# Patient Record
Sex: Female | Born: 1935 | Race: Black or African American | Hispanic: No | Smoking: Former smoker
Health system: Southern US, Community
[De-identification: ages and names within clinical notes are randomized; demographics above are authoritative.]

## PROBLEM LIST (undated history)

## (undated) DIAGNOSIS — E119 Type 2 diabetes mellitus without complications: Secondary | ICD-10-CM

## (undated) DIAGNOSIS — R42 Dizziness and giddiness: Secondary | ICD-10-CM

## (undated) DIAGNOSIS — J449 Chronic obstructive pulmonary disease, unspecified: Secondary | ICD-10-CM

## (undated) DIAGNOSIS — M1990 Unspecified osteoarthritis, unspecified site: Secondary | ICD-10-CM

## (undated) DIAGNOSIS — K219 Gastro-esophageal reflux disease without esophagitis: Secondary | ICD-10-CM

## (undated) DIAGNOSIS — I10 Essential (primary) hypertension: Secondary | ICD-10-CM

## (undated) DIAGNOSIS — F068 Other specified mental disorders due to known physiological condition: Secondary | ICD-10-CM

## (undated) DIAGNOSIS — J45909 Unspecified asthma, uncomplicated: Secondary | ICD-10-CM

## (undated) HISTORY — DX: Unspecified osteoarthritis, unspecified site: M19.90

## (undated) HISTORY — DX: Unspecified asthma, uncomplicated: J45.909

## (undated) HISTORY — DX: Essential (primary) hypertension: I10

## (undated) HISTORY — PX: CHOLECYSTECTOMY: SHX55

## (undated) HISTORY — DX: Other specified mental disorders due to known physiological condition: F06.8

## (undated) HISTORY — DX: Chronic obstructive pulmonary disease, unspecified: J44.9

## (undated) HISTORY — PX: FOOT SURGERY: SHX648

## (undated) HISTORY — DX: Gastro-esophageal reflux disease without esophagitis: K21.9

## (undated) HISTORY — DX: Type 2 diabetes mellitus without complications: E11.9

## (undated) HISTORY — PX: ABDOMINAL HYSTERECTOMY: SHX81

## (undated) HISTORY — DX: Dizziness and giddiness: R42

---

## 1997-11-24 ENCOUNTER — Other Ambulatory Visit: Admission: RE | Admit: 1997-11-24 | Discharge: 1997-11-24 | Payer: Self-pay | Admitting: Obstetrics and Gynecology

## 1997-12-08 ENCOUNTER — Ambulatory Visit (HOSPITAL_COMMUNITY): Admission: RE | Admit: 1997-12-08 | Discharge: 1997-12-08 | Payer: Self-pay | Admitting: Obstetrics and Gynecology

## 1998-05-25 ENCOUNTER — Other Ambulatory Visit: Admission: RE | Admit: 1998-05-25 | Discharge: 1998-05-25 | Payer: Self-pay | Admitting: Nephrology

## 1999-01-04 ENCOUNTER — Ambulatory Visit (HOSPITAL_COMMUNITY): Admission: RE | Admit: 1999-01-04 | Discharge: 1999-01-04 | Payer: Self-pay | Admitting: Nephrology

## 2000-01-10 ENCOUNTER — Ambulatory Visit (HOSPITAL_COMMUNITY): Admission: RE | Admit: 2000-01-10 | Discharge: 2000-01-10 | Payer: Self-pay | Admitting: Nephrology

## 2000-01-10 ENCOUNTER — Encounter: Payer: Self-pay | Admitting: Nephrology

## 2001-01-11 ENCOUNTER — Encounter: Payer: Self-pay | Admitting: Nephrology

## 2001-01-11 ENCOUNTER — Ambulatory Visit (HOSPITAL_COMMUNITY): Admission: RE | Admit: 2001-01-11 | Discharge: 2001-01-11 | Payer: Self-pay | Admitting: Nephrology

## 2001-05-10 ENCOUNTER — Encounter: Payer: Self-pay | Admitting: Nephrology

## 2001-05-10 ENCOUNTER — Encounter: Admission: RE | Admit: 2001-05-10 | Discharge: 2001-05-10 | Payer: Self-pay | Admitting: Nephrology

## 2001-10-12 ENCOUNTER — Other Ambulatory Visit: Admission: RE | Admit: 2001-10-12 | Discharge: 2001-10-12 | Payer: Self-pay | Admitting: Nephrology

## 2002-01-14 ENCOUNTER — Ambulatory Visit (HOSPITAL_COMMUNITY): Admission: RE | Admit: 2002-01-14 | Discharge: 2002-01-14 | Payer: Self-pay | Admitting: Nephrology

## 2002-01-14 ENCOUNTER — Encounter: Payer: Self-pay | Admitting: Nephrology

## 2002-12-18 ENCOUNTER — Emergency Department (HOSPITAL_COMMUNITY): Admission: EM | Admit: 2002-12-18 | Discharge: 2002-12-18 | Payer: Self-pay | Admitting: Emergency Medicine

## 2003-01-16 ENCOUNTER — Ambulatory Visit (HOSPITAL_COMMUNITY): Admission: RE | Admit: 2003-01-16 | Discharge: 2003-01-16 | Payer: Self-pay | Admitting: Nephrology

## 2003-01-16 ENCOUNTER — Encounter: Payer: Self-pay | Admitting: Nephrology

## 2003-07-12 ENCOUNTER — Emergency Department (HOSPITAL_COMMUNITY): Admission: AD | Admit: 2003-07-12 | Discharge: 2003-07-12 | Payer: Self-pay | Admitting: Internal Medicine

## 2003-07-14 ENCOUNTER — Inpatient Hospital Stay (HOSPITAL_COMMUNITY): Admission: EM | Admit: 2003-07-14 | Discharge: 2003-07-16 | Payer: Self-pay | Admitting: *Deleted

## 2003-09-23 ENCOUNTER — Encounter: Admission: RE | Admit: 2003-09-23 | Discharge: 2003-09-23 | Payer: Self-pay | Admitting: Nephrology

## 2003-10-11 ENCOUNTER — Inpatient Hospital Stay (HOSPITAL_COMMUNITY): Admission: EM | Admit: 2003-10-11 | Discharge: 2003-10-16 | Payer: Self-pay | Admitting: Emergency Medicine

## 2003-10-31 ENCOUNTER — Encounter (INDEPENDENT_AMBULATORY_CARE_PROVIDER_SITE_OTHER): Payer: Self-pay | Admitting: Specialist

## 2003-11-01 ENCOUNTER — Inpatient Hospital Stay (HOSPITAL_COMMUNITY): Admission: RE | Admit: 2003-11-01 | Discharge: 2003-11-04 | Payer: Self-pay | Admitting: General Surgery

## 2003-12-05 ENCOUNTER — Observation Stay (HOSPITAL_COMMUNITY): Admission: EM | Admit: 2003-12-05 | Discharge: 2003-12-06 | Payer: Self-pay | Admitting: Emergency Medicine

## 2004-02-01 ENCOUNTER — Emergency Department (HOSPITAL_COMMUNITY): Admission: EM | Admit: 2004-02-01 | Discharge: 2004-02-01 | Payer: Self-pay | Admitting: Emergency Medicine

## 2004-02-04 ENCOUNTER — Ambulatory Visit (HOSPITAL_COMMUNITY): Admission: RE | Admit: 2004-02-04 | Discharge: 2004-02-04 | Payer: Self-pay | Admitting: Nephrology

## 2004-06-20 ENCOUNTER — Emergency Department (HOSPITAL_COMMUNITY): Admission: EM | Admit: 2004-06-20 | Discharge: 2004-06-20 | Payer: Self-pay | Admitting: Emergency Medicine

## 2004-08-03 ENCOUNTER — Inpatient Hospital Stay (HOSPITAL_COMMUNITY): Admission: EM | Admit: 2004-08-03 | Discharge: 2004-08-09 | Payer: Self-pay | Admitting: Emergency Medicine

## 2004-08-03 ENCOUNTER — Ambulatory Visit: Payer: Self-pay | Admitting: Hematology and Oncology

## 2004-08-05 ENCOUNTER — Encounter (INDEPENDENT_AMBULATORY_CARE_PROVIDER_SITE_OTHER): Payer: Self-pay | Admitting: *Deleted

## 2004-08-08 ENCOUNTER — Ambulatory Visit: Payer: Self-pay | Admitting: Oncology

## 2004-08-12 ENCOUNTER — Encounter: Admission: RE | Admit: 2004-08-12 | Discharge: 2004-08-12 | Payer: Self-pay | Admitting: Nephrology

## 2004-08-18 ENCOUNTER — Ambulatory Visit: Payer: Self-pay | Admitting: Hematology and Oncology

## 2004-12-02 ENCOUNTER — Encounter: Admission: RE | Admit: 2004-12-02 | Discharge: 2004-12-02 | Payer: Self-pay | Admitting: Nephrology

## 2004-12-08 ENCOUNTER — Ambulatory Visit: Payer: Self-pay | Admitting: Hematology and Oncology

## 2005-01-27 ENCOUNTER — Ambulatory Visit (HOSPITAL_COMMUNITY): Admission: RE | Admit: 2005-01-27 | Discharge: 2005-01-27 | Payer: Self-pay | Admitting: Nephrology

## 2005-02-12 ENCOUNTER — Emergency Department (HOSPITAL_COMMUNITY): Admission: EM | Admit: 2005-02-12 | Discharge: 2005-02-12 | Payer: Self-pay | Admitting: Emergency Medicine

## 2005-02-18 ENCOUNTER — Encounter: Admission: RE | Admit: 2005-02-18 | Discharge: 2005-02-18 | Payer: Self-pay | Admitting: Nephrology

## 2005-02-23 ENCOUNTER — Encounter: Admission: RE | Admit: 2005-02-23 | Discharge: 2005-02-23 | Payer: Self-pay | Admitting: Nephrology

## 2005-05-26 ENCOUNTER — Ambulatory Visit (HOSPITAL_COMMUNITY): Admission: RE | Admit: 2005-05-26 | Discharge: 2005-05-26 | Payer: Self-pay | Admitting: Ophthalmology

## 2005-11-19 ENCOUNTER — Emergency Department (HOSPITAL_COMMUNITY): Admission: EM | Admit: 2005-11-19 | Discharge: 2005-11-19 | Payer: Self-pay | Admitting: Emergency Medicine

## 2006-01-24 ENCOUNTER — Ambulatory Visit (HOSPITAL_COMMUNITY): Admission: RE | Admit: 2006-01-24 | Discharge: 2006-01-24 | Payer: Self-pay | Admitting: Nephrology

## 2006-02-23 ENCOUNTER — Emergency Department (HOSPITAL_COMMUNITY): Admission: EM | Admit: 2006-02-23 | Discharge: 2006-02-23 | Payer: Self-pay | Admitting: Emergency Medicine

## 2006-04-25 ENCOUNTER — Encounter: Admission: RE | Admit: 2006-04-25 | Discharge: 2006-04-25 | Payer: Self-pay | Admitting: Nephrology

## 2006-05-15 ENCOUNTER — Emergency Department (HOSPITAL_COMMUNITY): Admission: EM | Admit: 2006-05-15 | Discharge: 2006-05-15 | Payer: Self-pay | Admitting: Emergency Medicine

## 2006-10-19 ENCOUNTER — Encounter: Admission: RE | Admit: 2006-10-19 | Discharge: 2006-10-19 | Payer: Self-pay | Admitting: Nephrology

## 2006-11-05 ENCOUNTER — Emergency Department (HOSPITAL_COMMUNITY): Admission: EM | Admit: 2006-11-05 | Discharge: 2006-11-05 | Payer: Self-pay | Admitting: Emergency Medicine

## 2007-01-27 ENCOUNTER — Emergency Department (HOSPITAL_COMMUNITY): Admission: EM | Admit: 2007-01-27 | Discharge: 2007-01-28 | Payer: Self-pay | Admitting: Emergency Medicine

## 2007-01-29 ENCOUNTER — Ambulatory Visit (HOSPITAL_COMMUNITY): Admission: RE | Admit: 2007-01-29 | Discharge: 2007-01-29 | Payer: Self-pay | Admitting: Nephrology

## 2007-01-30 ENCOUNTER — Ambulatory Visit (HOSPITAL_COMMUNITY): Admission: RE | Admit: 2007-01-30 | Discharge: 2007-01-30 | Payer: Self-pay | Admitting: Nephrology

## 2007-04-06 ENCOUNTER — Ambulatory Visit (HOSPITAL_COMMUNITY): Admission: RE | Admit: 2007-04-06 | Discharge: 2007-04-06 | Payer: Self-pay | Admitting: Ophthalmology

## 2007-07-05 ENCOUNTER — Emergency Department (HOSPITAL_COMMUNITY): Admission: EM | Admit: 2007-07-05 | Discharge: 2007-07-05 | Payer: Self-pay | Admitting: Emergency Medicine

## 2007-11-03 ENCOUNTER — Emergency Department (HOSPITAL_COMMUNITY): Admission: EM | Admit: 2007-11-03 | Discharge: 2007-11-03 | Payer: Self-pay | Admitting: Emergency Medicine

## 2008-01-19 ENCOUNTER — Emergency Department (HOSPITAL_COMMUNITY): Admission: EM | Admit: 2008-01-19 | Discharge: 2008-01-19 | Payer: Self-pay | Admitting: Emergency Medicine

## 2008-01-31 ENCOUNTER — Ambulatory Visit (HOSPITAL_COMMUNITY): Admission: RE | Admit: 2008-01-31 | Discharge: 2008-01-31 | Payer: Self-pay | Admitting: Otolaryngology

## 2008-02-24 ENCOUNTER — Emergency Department (HOSPITAL_COMMUNITY): Admission: EM | Admit: 2008-02-24 | Discharge: 2008-02-24 | Payer: Self-pay | Admitting: Emergency Medicine

## 2008-06-11 ENCOUNTER — Encounter: Admission: RE | Admit: 2008-06-11 | Discharge: 2008-06-11 | Payer: Self-pay | Admitting: Nephrology

## 2008-08-20 ENCOUNTER — Emergency Department (HOSPITAL_COMMUNITY): Admission: EM | Admit: 2008-08-20 | Discharge: 2008-08-20 | Payer: Self-pay | Admitting: Internal Medicine

## 2008-08-26 ENCOUNTER — Encounter: Admission: RE | Admit: 2008-08-26 | Discharge: 2008-08-26 | Payer: Self-pay | Admitting: Nephrology

## 2009-02-02 ENCOUNTER — Emergency Department (HOSPITAL_COMMUNITY): Admission: EM | Admit: 2009-02-02 | Discharge: 2009-02-02 | Payer: Self-pay | Admitting: Emergency Medicine

## 2009-02-09 ENCOUNTER — Ambulatory Visit (HOSPITAL_COMMUNITY): Admission: RE | Admit: 2009-02-09 | Discharge: 2009-02-09 | Payer: Self-pay | Admitting: Nephrology

## 2009-03-25 ENCOUNTER — Emergency Department (HOSPITAL_COMMUNITY): Admission: EM | Admit: 2009-03-25 | Discharge: 2009-03-25 | Payer: Self-pay | Admitting: Emergency Medicine

## 2009-03-26 ENCOUNTER — Inpatient Hospital Stay (HOSPITAL_COMMUNITY): Admission: EM | Admit: 2009-03-26 | Discharge: 2009-03-29 | Payer: Self-pay | Admitting: Emergency Medicine

## 2009-03-27 ENCOUNTER — Encounter (INDEPENDENT_AMBULATORY_CARE_PROVIDER_SITE_OTHER): Payer: Self-pay | Admitting: Internal Medicine

## 2009-06-13 ENCOUNTER — Inpatient Hospital Stay (HOSPITAL_COMMUNITY): Admission: EM | Admit: 2009-06-13 | Discharge: 2009-06-23 | Payer: Self-pay | Admitting: Emergency Medicine

## 2009-06-16 ENCOUNTER — Ambulatory Visit: Payer: Self-pay | Admitting: Vascular Surgery

## 2009-06-16 ENCOUNTER — Encounter (INDEPENDENT_AMBULATORY_CARE_PROVIDER_SITE_OTHER): Payer: Self-pay | Admitting: Internal Medicine

## 2009-06-18 LAB — CONVERTED CEMR LAB: Cholesterol: 134 mg/dL

## 2009-07-01 ENCOUNTER — Emergency Department (HOSPITAL_COMMUNITY): Admission: EM | Admit: 2009-07-01 | Discharge: 2009-07-02 | Payer: Self-pay | Admitting: Emergency Medicine

## 2009-07-15 ENCOUNTER — Encounter: Admission: RE | Admit: 2009-07-15 | Discharge: 2009-07-15 | Payer: Self-pay | Admitting: Nephrology

## 2009-07-26 ENCOUNTER — Emergency Department (HOSPITAL_COMMUNITY): Admission: EM | Admit: 2009-07-26 | Discharge: 2009-07-26 | Payer: Self-pay | Admitting: Emergency Medicine

## 2009-08-05 ENCOUNTER — Emergency Department (HOSPITAL_COMMUNITY): Admission: EM | Admit: 2009-08-05 | Discharge: 2009-08-05 | Payer: Self-pay | Admitting: Emergency Medicine

## 2009-09-28 ENCOUNTER — Emergency Department (HOSPITAL_COMMUNITY): Admission: EM | Admit: 2009-09-28 | Discharge: 2009-09-28 | Payer: Self-pay | Admitting: Emergency Medicine

## 2009-10-07 ENCOUNTER — Emergency Department (HOSPITAL_COMMUNITY): Admission: EM | Admit: 2009-10-07 | Discharge: 2009-10-08 | Payer: Self-pay | Admitting: Emergency Medicine

## 2009-10-13 ENCOUNTER — Encounter: Admission: RE | Admit: 2009-10-13 | Discharge: 2009-10-13 | Payer: Self-pay | Admitting: Nephrology

## 2009-10-14 ENCOUNTER — Emergency Department (HOSPITAL_COMMUNITY): Admission: EM | Admit: 2009-10-14 | Discharge: 2009-10-14 | Payer: Self-pay | Admitting: Emergency Medicine

## 2009-12-15 ENCOUNTER — Ambulatory Visit (HOSPITAL_BASED_OUTPATIENT_CLINIC_OR_DEPARTMENT_OTHER): Admission: RE | Admit: 2009-12-15 | Discharge: 2009-12-15 | Payer: Self-pay | Admitting: Obstetrics & Gynecology

## 2009-12-20 ENCOUNTER — Ambulatory Visit: Payer: Self-pay | Admitting: Internal Medicine

## 2010-01-01 ENCOUNTER — Emergency Department (HOSPITAL_COMMUNITY): Admission: EM | Admit: 2010-01-01 | Discharge: 2010-01-01 | Payer: Self-pay | Admitting: Emergency Medicine

## 2010-01-07 ENCOUNTER — Ambulatory Visit: Payer: Self-pay | Admitting: Vascular Surgery

## 2010-01-08 ENCOUNTER — Emergency Department (HOSPITAL_COMMUNITY): Admission: EM | Admit: 2010-01-08 | Discharge: 2010-01-08 | Payer: Self-pay | Admitting: Emergency Medicine

## 2010-01-27 ENCOUNTER — Emergency Department (HOSPITAL_COMMUNITY): Admission: EM | Admit: 2010-01-27 | Discharge: 2010-01-27 | Payer: Self-pay | Admitting: Emergency Medicine

## 2010-03-18 ENCOUNTER — Emergency Department (HOSPITAL_COMMUNITY): Admission: EM | Admit: 2010-03-18 | Discharge: 2010-03-18 | Payer: Self-pay | Admitting: Emergency Medicine

## 2010-03-21 ENCOUNTER — Emergency Department (HOSPITAL_COMMUNITY): Admission: EM | Admit: 2010-03-21 | Discharge: 2010-03-21 | Payer: Self-pay | Admitting: Emergency Medicine

## 2010-03-26 ENCOUNTER — Ambulatory Visit (HOSPITAL_COMMUNITY): Admission: RE | Admit: 2010-03-26 | Discharge: 2010-03-26 | Payer: Self-pay | Admitting: Internal Medicine

## 2010-03-26 LAB — HM MAMMOGRAPHY: HM Mammogram: NEGATIVE

## 2010-03-28 ENCOUNTER — Emergency Department (HOSPITAL_COMMUNITY): Admission: EM | Admit: 2010-03-28 | Discharge: 2010-03-28 | Payer: Self-pay | Admitting: Emergency Medicine

## 2010-04-14 ENCOUNTER — Emergency Department (HOSPITAL_COMMUNITY): Admission: EM | Admit: 2010-04-14 | Discharge: 2010-04-14 | Payer: Self-pay | Admitting: Emergency Medicine

## 2010-05-10 ENCOUNTER — Emergency Department (HOSPITAL_COMMUNITY): Admission: EM | Admit: 2010-05-10 | Discharge: 2010-05-11 | Payer: Self-pay | Admitting: Emergency Medicine

## 2010-05-10 LAB — CONVERTED CEMR LAB
ALT: 34 units/L
AST: 21 units/L
Alkaline Phosphatase: 123 units/L
Calcium: 9.6 mg/dL
Chloride: 100 meq/L
Eosinophils Relative: 8 %
Glucose, Bld: 134 mg/dL
Hemoglobin: 13.1 g/dL
Neutrophils Relative %: 68 %
RBC: 4.11 M/uL
WBC: 8.5 10*3/uL

## 2010-05-18 ENCOUNTER — Ambulatory Visit: Payer: Self-pay | Admitting: Internal Medicine

## 2010-05-18 DIAGNOSIS — E119 Type 2 diabetes mellitus without complications: Secondary | ICD-10-CM

## 2010-05-18 DIAGNOSIS — J45909 Unspecified asthma, uncomplicated: Secondary | ICD-10-CM

## 2010-05-18 DIAGNOSIS — M1990 Unspecified osteoarthritis, unspecified site: Secondary | ICD-10-CM

## 2010-05-18 DIAGNOSIS — I10 Essential (primary) hypertension: Secondary | ICD-10-CM | POA: Insufficient documentation

## 2010-05-18 DIAGNOSIS — F068 Other specified mental disorders due to known physiological condition: Secondary | ICD-10-CM

## 2010-05-18 DIAGNOSIS — J449 Chronic obstructive pulmonary disease, unspecified: Secondary | ICD-10-CM

## 2010-05-18 DIAGNOSIS — K219 Gastro-esophageal reflux disease without esophagitis: Secondary | ICD-10-CM

## 2010-05-18 DIAGNOSIS — J4489 Other specified chronic obstructive pulmonary disease: Secondary | ICD-10-CM | POA: Insufficient documentation

## 2010-05-18 DIAGNOSIS — R634 Abnormal weight loss: Secondary | ICD-10-CM

## 2010-05-18 HISTORY — DX: Chronic obstructive pulmonary disease, unspecified: J44.9

## 2010-05-18 HISTORY — DX: Unspecified osteoarthritis, unspecified site: M19.90

## 2010-05-18 HISTORY — DX: Gastro-esophageal reflux disease without esophagitis: K21.9

## 2010-05-18 HISTORY — DX: Other specified chronic obstructive pulmonary disease: J44.89

## 2010-05-18 HISTORY — DX: Other specified mental disorders due to known physiological condition: F06.8

## 2010-05-18 HISTORY — DX: Unspecified asthma, uncomplicated: J45.909

## 2010-05-18 HISTORY — DX: Type 2 diabetes mellitus without complications: E11.9

## 2010-05-18 HISTORY — DX: Essential (primary) hypertension: I10

## 2010-05-19 LAB — CONVERTED CEMR LAB
Hgb A1c MFr Bld: 6.5 % (ref 4.6–6.5)
TSH: 0.97 microintl units/mL (ref 0.35–5.50)

## 2010-05-27 ENCOUNTER — Emergency Department (HOSPITAL_COMMUNITY): Admission: EM | Admit: 2010-05-27 | Discharge: 2009-10-16 | Payer: Self-pay | Admitting: Emergency Medicine

## 2010-06-09 ENCOUNTER — Observation Stay (HOSPITAL_COMMUNITY)
Admission: EM | Admit: 2010-06-09 | Discharge: 2010-06-10 | Payer: Self-pay | Source: Home / Self Care | Attending: Internal Medicine | Admitting: Internal Medicine

## 2010-06-29 ENCOUNTER — Encounter: Payer: Self-pay | Admitting: Internal Medicine

## 2010-06-29 ENCOUNTER — Ambulatory Visit
Admission: RE | Admit: 2010-06-29 | Discharge: 2010-06-29 | Payer: Self-pay | Source: Home / Self Care | Attending: Internal Medicine | Admitting: Internal Medicine

## 2010-06-29 DIAGNOSIS — R42 Dizziness and giddiness: Secondary | ICD-10-CM

## 2010-06-29 HISTORY — DX: Dizziness and giddiness: R42

## 2010-07-11 ENCOUNTER — Encounter: Payer: Self-pay | Admitting: Nephrology

## 2010-07-12 ENCOUNTER — Encounter: Payer: Self-pay | Admitting: Nephrology

## 2010-07-14 ENCOUNTER — Inpatient Hospital Stay (HOSPITAL_COMMUNITY)
Admission: EM | Admit: 2010-07-14 | Discharge: 2010-07-19 | Payer: Self-pay | Source: Home / Self Care | Attending: Internal Medicine | Admitting: Internal Medicine

## 2010-07-14 LAB — COMPREHENSIVE METABOLIC PANEL
BUN: 19 mg/dL (ref 6–23)
CO2: 23 mEq/L (ref 19–32)
Calcium: 9.8 mg/dL (ref 8.4–10.5)
Creatinine, Ser: 0.69 mg/dL (ref 0.4–1.2)
GFR calc non Af Amer: >60 mL/min (ref >60)
Glucose, Bld: 173 mg/dL — ABNORMAL HIGH (ref 70–99)

## 2010-07-14 LAB — URINALYSIS, ROUTINE W REFLEX MICROSCOPIC
Hgb urine dipstick: NEGATIVE
Ketones, ur: NEGATIVE mg/dL
Protein, ur: NEGATIVE mg/dL
Urine Glucose, Fasting: NEGATIVE mg/dL
pH: 6 (ref 5.0–8.0)

## 2010-07-14 LAB — LIPASE, BLOOD: Lipase: 215 U/L — ABNORMAL HIGH (ref 11–59)

## 2010-07-14 LAB — DIFFERENTIAL
Eosinophils Absolute: 0.2 10*3/uL (ref 0.0–0.7)
Lymphs Abs: 2.3 10*3/uL (ref 0.7–4.0)
Monocytes Relative: 6 % (ref 3–12)
Neutrophils Relative %: 67 % (ref 43–77)

## 2010-07-14 LAB — POCT CARDIAC MARKERS
CKMB, poc: <1 ng/mL — ABNORMAL LOW (ref 1.0–8.0)
CKMB, poc: <1 ng/mL — ABNORMAL LOW (ref 1.0–8.0)
Myoglobin, poc: 53.8 ng/mL (ref 12–200)
Myoglobin, poc: 55.3 ng/mL (ref 12–200)

## 2010-07-14 LAB — URINE MICROSCOPIC-ADD ON

## 2010-07-14 LAB — CBC
HCT: 38.1 % (ref 36.0–46.0)
MCH: 31.1 pg (ref 26.0–34.0)
MCV: 89.2 fL (ref 78.0–100.0)
Platelets: 239 10*3/uL (ref 150–400)
RBC: 4.27 MIL/uL (ref 3.87–5.11)

## 2010-07-15 LAB — LIPID PANEL
LDL Cholesterol: 83 mg/dL (ref 0–99)
Total CHOL/HDL Ratio: 3.7 RATIO
Triglycerides: 64 mg/dL (ref <150)
VLDL: 13 mg/dL (ref 0–40)

## 2010-07-15 LAB — GLUCOSE, CAPILLARY: Glucose-Capillary: 79 mg/dL (ref 70–99)

## 2010-07-15 LAB — CBC
MCH: 30.9 pg (ref 26.0–34.0)
MCHC: 34.5 g/dL (ref 30.0–36.0)
MCV: 89.4 fL (ref 78.0–100.0)
Platelets: 212 10*3/uL (ref 150–400)
RBC: 3.79 MIL/uL — ABNORMAL LOW (ref 3.87–5.11)

## 2010-07-15 LAB — COMPREHENSIVE METABOLIC PANEL
ALT: 14 U/L (ref 0–35)
Albumin: 3.2 g/dL — ABNORMAL LOW (ref 3.5–5.2)
Alkaline Phosphatase: 77 U/L (ref 39–117)
Chloride: 107 mEq/L (ref 96–112)
Glucose, Bld: 156 mg/dL — ABNORMAL HIGH (ref 70–99)
Potassium: 3.8 mEq/L (ref 3.5–5.1)
Sodium: 138 mEq/L (ref 135–145)
Total Bilirubin: 0.5 mg/dL (ref 0.3–1.2)
Total Protein: 6.5 g/dL (ref 6.0–8.3)

## 2010-07-15 LAB — TSH: TSH: 0.749 u[IU]/mL (ref 0.350–4.500)

## 2010-07-15 LAB — CARDIAC PANEL(CRET KIN+CKTOT+MB+TROPI)
CK, MB: 1 ng/mL (ref 0.3–4.0)
Relative Index: INVALID (ref 0.0–2.5)
Total CK: 56 U/L (ref 7–177)
Troponin I: 0.03 ng/mL (ref 0.00–0.06)
Troponin I: 0.01 ng/mL (ref 0.00–0.06)

## 2010-07-16 LAB — LIPASE, BLOOD: Lipase: 35 U/L (ref 11–59)

## 2010-07-16 LAB — GLUCOSE, CAPILLARY
Glucose-Capillary: 100 mg/dL — ABNORMAL HIGH (ref 70–99)
Glucose-Capillary: 107 mg/dL — ABNORMAL HIGH (ref 70–99)

## 2010-07-17 LAB — COMPREHENSIVE METABOLIC PANEL
AST: 16 U/L (ref 0–37)
Albumin: 3.1 g/dL — ABNORMAL LOW (ref 3.5–5.2)
Alkaline Phosphatase: 81 U/L (ref 39–117)
Chloride: 109 mEq/L (ref 96–112)
GFR calc Af Amer: >60 mL/min (ref >60)
Potassium: 3.6 mEq/L (ref 3.5–5.1)
Total Bilirubin: 0.6 mg/dL (ref 0.3–1.2)
Total Protein: 6.3 g/dL (ref 6.0–8.3)

## 2010-07-17 LAB — GLUCOSE, CAPILLARY
Glucose-Capillary: 87 mg/dL (ref 70–99)
Glucose-Capillary: 128 mg/dL — ABNORMAL HIGH (ref 70–99)

## 2010-07-18 LAB — GLUCOSE, CAPILLARY
Glucose-Capillary: 101 mg/dL — ABNORMAL HIGH (ref 70–99)
Glucose-Capillary: 123 mg/dL — ABNORMAL HIGH (ref 70–99)
Glucose-Capillary: 137 mg/dL — ABNORMAL HIGH (ref 70–99)
Glucose-Capillary: 89 mg/dL (ref 70–99)
Glucose-Capillary: 98 mg/dL (ref 70–99)

## 2010-07-19 LAB — GLUCOSE, CAPILLARY: Glucose-Capillary: 107 mg/dL — ABNORMAL HIGH (ref 70–99)

## 2010-07-20 NOTE — Assessment & Plan Note (Signed)
Summary: NEW/MEDICARE/REGULAR MEDICAID 2ND INS/#/CD   Vital Signs:  Patient profile:   77 year old female Height:      65 inches (165.10 cm) Weight:      143.8 pounds (65.36 kg) BMI:     24.02 O2 Sat:      97 % on Room air Temp:     98.2 degrees F (36.78 degrees C) oral Pulse rate:   110 / minute BP sitting:   128 / 72  (left arm) Cuff size:   regular  Vitals Entered By: Orlan Leavens RMA (May 18, 2010 9:43 AM)  O2 Flow:  Room air CC: New patient Is Patient Diabetic? Yes Did you bring your meter with you today? No Pain Assessment Patient in pain? no      Comments Was d/c from hosp 05/11/10.    Primary Care Provider:  Newt Lukes MD  CC:  New patient.  History of Present Illness: new pt to me and our pratice, here with dtr shelia who provides most of pt hx due to mild dementia - pt prev followed with dr. Jamey Ripa and also with dr. Tamela Oddi who rec my eval  c/o unintentional weight loss and sweating >15lbs unintentional loss in past 6 mo, symptoms progressively worse a/w severe night sweats, but also daytime sweat spells in past 6 weeks problem has resulted in poor sleep and inc daytime fatigue diarrrhea x 2 weeks, also cough but ER eval last week for cough neg (ER records/labs reviewed) no known hx cancer personally or FH- no recent medication changes -  no HA, abd pain, change urination or SOB/DOE sleep study done 11/2009 for same (per dtr) but no explaination for sweating was found per pt  reviewed chronic med issues 1) DM2 - follows with endo for same - reports compliance with ongoing medical treatment and no changes in medication dose or frequency. denies adverse side effects related to current therapy. checks sugars x/d, no symptoms hypoglycemia  2) HTN - reports compliance with ongoing medical treatment and no changes in medication dose or frequency. denies adverse side effects related to current therapy. no CP, occ HA but no weakness, vision change  or hx TIA/CVA -no CAD/CHF hx  3) GERD - reports compliance with ongoing medical treatment and no changes in medication dose or frequency. denies adverse side effects related to current therapy.   4) COPD/ "asthma" - remote smoking but no chronic dyspnea or bronhitis symptoms   5) OA - right knee>left knee and hip but also generalized pain - relieves same with tylenol as needed - no swelling or falls  Preventive Screening-Counseling & Management  Alcohol-Tobacco     Alcohol drinks/day: 0     Alcohol Counseling: not indicated; patient does not drink     Smoking Status: quit     Year Quit: 1970s     Tobacco Counseling: not to resume use of tobacco products  Caffeine-Diet-Exercise     Does Patient Exercise: yes     Exercise Counseling: not indicated; exercise is adequate     Depression Counseling: not indicated; screening negative for depression  Safety-Violence-Falls     Seat Belt Counseling: not indicated; patient wears seat belts     Helmet Counseling: not applicable     Firearm Counseling: not applicable     Violence Counseling: not indicated; no violence risk noted     Fall Risk Counseling: not indicated; no significant falls noted  Clinical Review Panels:  Prevention   [facility]  [facility] No specific mammographic evidence of malignancy.  Assessment: BIRADS 1. (03/26/2010)  Diabetes Management   Creatinine:  0.99 (05/10/2010)  CBC   WBC:  8.5 (05/10/2010)   RBC:  4.11 (05/10/2010)   Hgb:  13.1 (05/10/2010)   Hct:  37.9 (05/10/2010)   Platelets:  263 (05/10/2010)   MCV  92.2 (05/10/2010)   RDW  13.2 (05/10/2010)   PMN:  68 (05/10/2010)   Monos:  7 (05/10/2010)   Eosinophils:  8 (05/10/2010)  Complete Metabolic Panel   Glucose:  134 (05/10/2010)   Sodium:  142 (05/10/2010)   Potassium:  3.7 (05/10/2010)   Chloride:  100 (05/10/2010)   CO2:  28 (05/10/2010)   BUN:  21 (05/10/2010)   Creatinine:  0.99 (05/10/2010)   Albumin:  3.6 (05/10/2010)    Total Protein:  7.6 (05/10/2010)   Calcium:  9.6 (05/10/2010)   Total Bili:  0.4 (05/10/2010)   Alk Phos:  123 (05/10/2010)   SGPT (ALT):  34 (05/10/2010)   SGOT (AST):  21 (05/10/2010)   -  Date:  05/10/2010    WBC: 8.5    HGB: 13.1    HCT: 37.9    RBC: 4.11    PLT: 263    MCV: 92.2    RDW: 13.2    Neutrophil: 68    Lymphs: 17    Monos: 7    Eos: 8    BG Random: 134    Sodium: 142    Potassium: 3.7    Chloride: 100    CO2 Total: 28    BUN: 21    Creatinine: 0.99    SGOT (AST): 21    SGPT (ALT): 34    T. Bilirubin: 0.4    Alk Phos: 123    Calcium: 9.6    Total Protein: 7.6    Albumin: 3.6  Date:  06/18/2009    Cholesterol: 134    LDL: 72    HDL: 45    Triglycerides: 87  Current Medications (verified): 1)  Align  Caps (Probiotic Product) .... Take 1 By Mouth Once Daily 2)  Benicar Hct 40-25 Mg Tabs (Olmesartan Medoxomil-Hctz) .... Take 1 By Mouth Once Daily 3)  Donepezil Hcl 10 Mg Tabs (Donepezil Hcl) .... Take 1 By Mouth Once Daily 4)  Vitamin B-12 1000 Mcg Tabs (Cyanocobalamin) .... Take 1 By Mouth Once Daily 5)  Aspirin 81 Mg Tbec (Aspirin) .... Take 1 By Mouth Once Daily 6)  Nexium 40 Mg Cpdr (Esomeprazole Magnesium) .... Take 1 By Mouth Once Daily 7)  Singulair 10 Mg Tabs (Montelukast Sodium) .... Take 1 By Mouth Once Daily 8)  Metformin Hcl 500 Mg Tabs (Metformin Hcl) .... Take 1 By Mouth Once Daily  Allergies (verified): 1)  ! Codeine 2)  ! Morphine 3)  ! Penicillin  Past History:  Past Medical History: Asthma/COPD Diabetes mellitus, type II Hypertension Dementia, mild SVD GERD Osteoarthritis  MD roster: optho - brewington podi - pettricz endo - sanders  Past Surgical History: Cholecystectomy Hysterectomy foot surgery x's 2  Family History: Family History of Arthritis (parent) Family History Hypertension (parent) no hx cancer dad expired age 57y - good health  Social History: Former Smoker, quit 1970s no alcohol widowed,  lives with dtr shelia, youngest son and shelia's 25yo dtr retired Smoking Status:  quit Does Patient Exercise:  yes  Review of Systems       see HPI above. I have reviewed all other systems and they were negative.   Physical  Exam  General:  alert, well-developed, well-nourished, and cooperative to examination.   dtr at side Head:  Normocephalic and atraumatic without obvious abnormalities. No apparent alopecia or balding. Eyes:  vision grossly intact; pupils equal, round and reactive to light.  conjunctiva and lids normal.    Ears:  normal pinnae bilaterally, without erythema, swelling, or tenderness to palpation. TMs clear, without effusion, or cerumen impaction. Hearing grossly normal bilaterally  Mouth:  teeth and gums in good repair; mucous membranes moist, without lesions or ulcers. oropharynx clear without exudate, no erythema.  Neck:  No deformities, masses, or tenderness noted. Lungs:  normal respiratory effort, no intercostal retractions or use of accessory muscles; normal breath sounds bilaterally - no crackles and no wheezes.    Heart:  normal rate, regular rhythm, no murmur, and no rub. BLE without edema. normal DP pulses and normal cap refill in all 4 extremities    Abdomen:  soft, non-tender, normal bowel sounds, no distention; no masses and no appreciable hepatomegaly or splenomegaly.   Msk:  No deformity or scoliosis noted of thoracic or lumbar spine.  right foot in walking cast from recent hammertoe surg Neurologic:  alert & oriented X3 and cranial nerves II-XII symetrically intact.  strength normal in all extremities, sensation intact to light touch, and gait normal. speech fluent without dysarthria or aphasia; follows commands with good comprehension.  Skin:  no rashes, vesicles, ulcers, or erythema. No nodules or irregularity to palpation.  Cervical Nodes:  No lymphadenopathy noted Axillary Nodes:  No palpable lymphadenopathy Psych:  Oriented X3, memory intact for recent  < remote, normally interactive, good eye contact, not anxious appearing, not depressed appearing, and not agitated.      Impression & Recommendations:  Problem # 1:  WEIGHT LOSS, ABNORMAL (ICD-783.21) a/w "severe" night swaets and sleep disturbance - recent CBC and CMet at ER 05/10/10 normal -  ?thyroid dz - check same send for records from PCP/gyn for prior eval and consider need for other imagaing/tests as needed  Orders: TLB-TSH (Thyroid Stimulating Hormone) (84443-TSH)  Problem # 2:  DIABETES MELLITUS, TYPE II (ICD-250.00)  Her updated medication list for this problem includes:    Benicar Hct 40-25 Mg Tabs (Olmesartan medoxomil-hctz) .Marland Kitchen... Take 1 by mouth once daily    Aspirin 81 Mg Tbec (Aspirin) .Marland Kitchen... Take 1 by mouth once daily    Metformin Hcl 500 Mg Tabs (Metformin hcl) .Marland Kitchen... Take 1 by mouth once daily  Orders: TLB-A1C / Hgb A1C (Glycohemoglobin) (83036-A1C)  Problem # 3:  COPD (ICD-496)  Her updated medication list for this problem includes:    Singulair 10 Mg Tabs (Montelukast sodium) .Marland Kitchen... Take 1 by mouth once daily    Albuterol Sulfate 0.63 Mg/62ml Nebu (Albuterol sulfate) ..... Every 4 hours as needed for breathing  Pulmonary Functions Reviewed: O2 sat: 97 (05/18/2010)     Problem # 4:  HYPERTENSION (ICD-401.9)  Her updated medication list for this problem includes:    Benicar Hct 40-25 Mg Tabs (Olmesartan medoxomil-hctz) .Marland Kitchen... Take 1 by mouth once daily  BP today: 128/72  Problem # 5:  OSTEOARTHRITIS (ICD-715.90)  Her updated medication list for this problem includes:    Aspirin 81 Mg Tbec (Aspirin) .Marland Kitchen... Take 1 by mouth once daily  Discussed use of medications, application of heat or cold, and exercises.   Problem # 6:  GERD (ICD-530.81)  Her updated medication list for this problem includes:    Nexium 40 Mg Cpdr (Esomeprazole magnesium) .Marland Kitchen... Take 1 by mouth once  daily  Problem # 7:  DEMENTIA (ICD-294.8) mild symptoms - prev on aricept per hosp  records  Time spent with patient and dtr 45 minutes, more than 50% of this time was spent counseling patient on weight loss and sweat symptoms, review of hosp records/labs including recent ER visit 05/10/10 and plans to get records from prior OP providers  Complete Medication List: 1)  Align Caps (Probiotic product) .... Take 1 by mouth once daily 2)  Benicar Hct 40-25 Mg Tabs (Olmesartan medoxomil-hctz) .... Take 1 by mouth once daily 3)  Donepezil Hcl 10 Mg Tabs (Donepezil hcl) .... Take 1 by mouth once daily 4)  Vitamin B-12 1000 Mcg Tabs (Cyanocobalamin) .... Take 1 by mouth once daily 5)  Aspirin 81 Mg Tbec (Aspirin) .... Take 1 by mouth once daily 6)  Nexium 40 Mg Cpdr (Esomeprazole magnesium) .... Take 1 by mouth once daily 7)  Singulair 10 Mg Tabs (Montelukast sodium) .... Take 1 by mouth once daily 8)  Metformin Hcl 500 Mg Tabs (Metformin hcl) .... Take 1 by mouth once daily 9)  Albuterol Sulfate 0.63 Mg/38ml Nebu (Albuterol sulfate) .... Every 4 hours as needed for breathing  Patient Instructions: 1)  it was good to see you today. 2)  medications and history reviewed today - no medicine changes today 3)  test(s) ordered today - your results will be posted on the phone tree for review in 48-72 hours from the time of test completion; call [phone] and enter your 9 digit MRN (listed above on this page, just below your name); if any changes need to be made or there are abnormal results, you will be contacted directly. 4)  will send for records from dr. Tamela Oddi to review 5)  Please schedule a follow-up appointment in 6 weeks to continue review, call sooner if problems.    Orders Added: 1)  TLB-TSH (Thyroid Stimulating Hormone) [84443-TSH] 2)  TLB-A1C / Hgb A1C (Glycohemoglobin) [83036-A1C] 3)  New Patient Level IV [09811]     Mammogram  Procedure date:  03/26/2010  Findings:      [facility] No specific mammographic evidence of malignancy.  Assessment: BIRADS  1.   CXR  Procedure date:  05/10/2010  Findings:      Impression; emphysematous and bronchitic changes. Minimal right basilar scarring

## 2010-07-22 NOTE — Assessment & Plan Note (Signed)
Summary: 6 WK FU---STC   Vital Signs:  Patient profile:   77 year old female Height:      65 inches (165.10 cm) Weight:      143 pounds (65 kg) O2 Sat:      97 % on Room air Temp:     98.2 degrees F (36.78 degrees C) oral Pulse rate:   49 / minute BP sitting:   122 / 70  (left arm) Cuff size:   regular  Vitals Entered By: Orlan Leavens RMA (June 29, 2010 1:05 PM)  O2 Flow:  Room air CC: 6 week f/u Is Patient Diabetic? Yes Did you bring your meter with you today? No Pain Assessment Patient in pain? no      CBG Result 123 Comments Recheck pulse 100. C/o dizziness since this  am   Primary Care Provider:  Newt Lukes MD  CC:  6 week f/u.  History of Present Illness: here with dtr shelia who provides most of pt hx due to mild dementia - pt prev followed with dr. Jamey Ripa and also with dr. Tamela Oddi who rec my eval  c/o dizziness and light head feeling onset 24h ago - precipitated by having ears cleaned at audiology yest  no ear pain, hearing change or drainage no HA, tinnitus or fever - no pulse no SOB or CP +hx same - improved with diazepam  reviewed other issues unintentional weight loss and sweating >15lbs unintentional loss starting summer 2011, symptoms progressively worse a/w severe night sweats, but also daytime sweat spells since 04/2010 problem has resulted in poor sleep and inc daytime fatigue no known hx cancer personally or FH- no recent medication changes -  no HA, abd pain, change urination or SOB/DOE sleep study done 11/2009 for same (per dtr) but no explaination for sweating was found per pt improved appetite and less sweating with colder weather  DM2 - follows with endo for same - reports compliance with ongoing medical treatment and no changes in medication dose or frequency. denies adverse side effects related to current therapy. checks sugars 1-2 x/d, no symptoms hypoglycemia  HTN - reports compliance with ongoing medical treatment and  no changes in medication dose or frequency. denies adverse side effects related to current therapy. no CP, occ HA but no weakness, vision change or hx TIA/CVA -no CAD/CHF hx  GERD - reports compliance with ongoing medical treatment and no changes in medication dose or frequency. denies adverse side effects related to current therapy.   COPD/ "asthma" - remote smoking but no chronic dyspnea or bronhitis symptoms   OA - right knee>left knee and hip but also generalized pain - relieves same with tylenol as needed - no swelling or falls  Clinical Review Panels:  Lipid Management   Cholesterol:  134 (06/18/2009)   LDL (bad choesterol):  72 (06/18/2009)   HDL (good cholesterol):  45 (06/18/2009)   Triglycerides:  87 (06/18/2009)  CBC   WBC:  8.5 (05/10/2010)   RBC:  4.11 (05/10/2010)   Hgb:  13.1 (05/10/2010)   Hct:  37.9 (05/10/2010)   Platelets:  263 (05/10/2010)   MCV  92.2 (05/10/2010)   RDW  13.2 (05/10/2010)   PMN:  68 (05/10/2010)   Monos:  7 (05/10/2010)   Eosinophils:  8 (05/10/2010)  Complete Metabolic Panel   Glucose:  134 (05/10/2010)   Sodium:  142 (05/10/2010)   Potassium:  3.7 (05/10/2010)   Chloride:  100 (05/10/2010)   CO2:  28 (05/10/2010)   BUN:  21 (05/10/2010)   Creatinine:  0.99 (05/10/2010)   Albumin:  3.6 (05/10/2010)   Total Protein:  7.6 (05/10/2010)   Calcium:  9.6 (05/10/2010)   Total Bili:  0.4 (05/10/2010)   Alk Phos:  123 (05/10/2010)   SGPT (ALT):  34 (05/10/2010)   SGOT (AST):  21 (05/10/2010)   Current Medications (verified): 1)  Align  Caps (Probiotic Product) .... Take 1 By Mouth Once Daily 2)  Benicar Hct 40-25 Mg Tabs (Olmesartan Medoxomil-Hctz) .... Take 1 By Mouth Once Daily 3)  Donepezil Hcl 10 Mg Tabs (Donepezil Hcl) .... Take 1 By Mouth Once Daily 4)  Vitamin B-12 1000 Mcg Tabs (Cyanocobalamin) .... Take 1 By Mouth Once Daily 5)  Aspirin 81 Mg Tbec (Aspirin) .... Take 1 By Mouth Once Daily 6)  Nexium 40 Mg Cpdr (Esomeprazole  Magnesium) .... Take 1 By Mouth Once Daily 7)  Singulair 10 Mg Tabs (Montelukast Sodium) .... Take 1 By Mouth Once Daily 8)  Metformin Hcl 500 Mg Tabs (Metformin Hcl) .... Take 1 By Mouth Once Daily 9)  Albuterol Sulfate 0.63 Mg/44ml Nebu (Albuterol Sulfate) .... Every 4 Hours As Needed For Breathing  Allergies (verified): 1)  ! Codeine 2)  ! Morphine 3)  ! Penicillin  Past History:  Past Medical History: Asthma/COPD Diabetes mellitus, type II Hypertension  Dementia, mild SVD GERD  Osteoarthritis  MD roster: optho - brewington podi - pettricz endo - sanders  Review of Systems       The patient complains of weight loss.  The patient denies fever, weight gain, chest pain, syncope, and headaches.    Physical Exam  General:  alert, well-developed, well-nourished, and cooperative to examination.   dtr at side Eyes:  vision grossly intact; pupils equal, round and reactive to light.  conjunctiva and lids normal.   no nystagmus Ears:  normal pinnae bilaterally, without erythema, swelling, or tenderness to palpation. TMs clear, without effusion, or cerumen impaction. Hearing grossly normal bilaterally  Mouth:  teeth and gums in good repair; mucous membranes moist, without lesions or ulcers. oropharynx clear without exudate, no erythema.  Neck:  No deformities, masses, or tenderness noted. Lungs:  normal respiratory effort, no intercostal retractions or use of accessory muscles; normal breath sounds bilaterally - no crackles and no wheezes.    Heart:  normal rate, regular rhythm, no murmur, and no rub. BLE without edema.    Impression & Recommendations:  Problem # 1:  DIZZINESS (ICD-780.4)  suspect inner ear - bppv - tx with meclizine - erx done no neuro deficits on exam - ekg today r/o cardiac cause (hr initially 49? 90s during my exam) - no arryhtmia or ischemia noted reviewed same with dtr/pt - Patient to call to be seen if no improvement in 10-14 days, sooner if worse.    Orders: Prescription Created Electronically [id number]) EKG w/ Interpretation (93000)  Her updated medication list for this problem includes:    Meclizine Hcl 12.5 Mg Tabs (Meclizine hcl) .Marland Kitchen... 1 by mouth every 6 hours as needed for dizzy symptoms - may cause sedation or confusion so use with supervision  Problem # 2:  WEIGHT LOSS, ABNORMAL (ICD-783.21)  a/w "severe" night swaets and sleep disturbance - recent  labs normal - and symptoms improved with weather change CT a/p with cm 05/10/10 - no LAD or other mass/abn seen - (reviewed today)  Problem # 3:  HYPERTENSION (ICD-401.9)  Her updated medication list for this problem includes:    Benicar Hct 40-25 Mg Tabs (  Olmesartan medoxomil-hctz) .Marland Kitchen... Take 1 by mouth once daily  BP today: 122/70 Prior BP: 128/72 (05/18/2010)  Labs Reviewed: K+: 3.7 (05/10/2010) Creat: : 0.99 (05/10/2010)   Chol: 134 (06/18/2009)   HDL: 45 (06/18/2009)   LDL: 72 (06/18/2009)   TG: 87 (06/18/2009)  Orders: EKG w/ Interpretation (93000)  Problem # 4:  DIABETES MELLITUS, TYPE II (ICD-250.00)  Her updated medication list for this problem includes:    Benicar Hct 40-25 Mg Tabs (Olmesartan medoxomil-hctz) .Marland Kitchen... Take 1 by mouth once daily    Aspirin 81 Mg Tbec (Aspirin) .Marland Kitchen... Take 1 by mouth once daily    Metformin Hcl 500 Mg Tabs (Metformin hcl) .Marland Kitchen... Take 1 by mouth once daily  Orders: Glucose, (CBG) [phone])  Labs Reviewed: Creat: 0.99 (05/10/2010)    Reviewed HgBA1c results: 6.5 (05/18/2010)  Complete Medication List: 1)  Align Caps (Probiotic product) .... Take 1 by mouth once daily 2)  Benicar Hct 40-25 Mg Tabs (Olmesartan medoxomil-hctz) .... Take 1 by mouth once daily 3)  Donepezil Hcl 10 Mg Tabs (Donepezil hcl) .... Take 1 by mouth once daily 4)  Vitamin B-12 1000 Mcg Tabs (Cyanocobalamin) .... Take 1 by mouth once daily 5)  Aspirin 81 Mg Tbec (Aspirin) .... Take 1 by mouth once daily 6)  Nexium 40 Mg Cpdr (Esomeprazole magnesium) .... Take  1 by mouth once daily 7)  Singulair 10 Mg Tabs (Montelukast sodium) .... Take 1 by mouth once daily 8)  Metformin Hcl 500 Mg Tabs (Metformin hcl) .... Take 1 by mouth once daily 9)  Albuterol Sulfate 0.63 Mg/64ml Nebu (Albuterol sulfate) .... Every 4 hours as needed for breathing 10)  Meclizine Hcl 12.5 Mg Tabs (Meclizine hcl) .Marland Kitchen.. 1 by mouth every 6 hours as needed for dizzy symptoms - may cause sedation or confusion so use with supervision  Patient Instructions: 1)  it was good to see you today. 2)  ekg shows normal heart rythym - 3)  use meclizine for dizzy symptoms - your prescription has been electronically submitted to your pharmacy. Please take as directed. Contact our office if you believe you're having problems with the medication(s).  4)  no other medication changes today 5)  Please schedule a follow-up appointment in 3 months to review diabetes and other concerns, call sooner if problems.  Prescriptions: MECLIZINE HCL 12.5 MG TABS (MECLIZINE HCL) 1 by mouth every 6 hours as needed for dizzy symptoms - may cause sedation or confusion so use with supervision  #20 x 0   Entered and Authorized by:   Newt Lukes MD   Signed by:   Newt Lukes MD on 06/29/2010   Method used:   Electronically to        [id number] (retail)       [address]       [place], [place]  [address]       Ph: [phone]       Fax: [phone]   RxID:   [id number]    Orders Added: 1)  Glucose, (CBG) [82962] 2)  Est. Patient Level IV [26948] 3)  Prescription Created Electronically [G8553] 4)  EKG w/ Interpretation [93000]    Laboratory Results   Blood Tests     CBG Random:: 123mg /dL

## 2010-07-28 NOTE — Discharge Summary (Signed)
[name], [name] NO.:  [id number]  MEDICAL RECORD NO.:  [id number]          PATIENT TYPE:  INP  LOCATION:  1502                         FACILITY:  [facility]  PHYSICIAN:  Clydia Llano, MD       DATE OF BIRTH:  Oct 20, 1927  DATE OF ADMISSION:  07/14/2010 DATE OF DISCHARGE:                              DISCHARGE SUMMARY   PRIMARY CARE PHYSICIAN:  Robyn N. Allyne Gee, M.D.  REASON FOR ADMISSION:  Abdominal pain.  DISCHARGE DIAGNOSES: 1. Acute pancreatitis. 2. Diabetes mellitus type 2. 3. Hypertension. 4. Anxiety disorder. 5. Chronic obstructive pulmonary disease. 6. Mild dementia. 7. Osteoarthritis. 8. Gastroesophageal reflux disease.  DISCHARGE DIAGNOSES: 1. Fem-Away OTC 2 capsules p.o. daily. 2. Albuterol inhaler 2 puffs inhaled every 4 hours as needed for     shortness of breath. 3. Azelastine 0.15% nasal spray twice daily as needed for allergy. 4. Aricept 10 mg p.o. every evening. 5. Aspirin 81 mg p.o. daily. 6. Benicar HCT 40/25 mg 1 tablet p.o. daily. 7. Janumet 50/500 mg 1 tablet p.o. b.i.d. 8. Singulair 10 mg p.o. at bedtime. 9. Tylenol Extra Strength 500 mg every 6 hours as needed for pain. 10.Vitamin B12 is 1000 mcg p.o. daily.  RADIOLOGY:  Ultrasound abdominal July 16, 2010, showed no acute findings, status post cholecystectomy, unchanged intrahepatic biliary ductal dilatation likely related to the patient age and gallbladder removal.  Acute abdomen x-rays showed hyperexpansion without acute cardiopulmonary disease, nonspecific nonobstructive bowel gas pattern.</BRIEF HISTORY AND  EXAMINATION:  [name] is an 77 year old female with mild dementia, diabetes, and hypertension.  The patient presented with epigastric pain complaining about nausea and vomiting multiple times prior to admission. The patient denies chest pain, shortness of breath, or palpitation.  The patient is status post cholecystectomy.  Denies fever, diarrhea,  or constipation.  Upon initial evaluation in the emergency department, the patient did have lipase level of 215 consistent with pancreatitis.  The patient admitted for acute pancreatitis.  HOSPITAL COURSE: 1. Acute pancreatitis.  The patient's acute pancreatitis was mild.     The patient was put n.p.o. and advanced quickly to clears, then     full liquids and then on regular diet.  On the day of discharge,     the patient was tolerating regular food.  No abdominal pain.  No     nausea, vomiting.  The patient discharged to follow up with primary     care physician. 2. The patient is status post cholecystectomy.  The patient does not     drink alcohol.  Her triglyceride level is normal.  There are 2     medications in her medication profile that can cause pancreatitis     which is hydrochlorothiazide and sitagliptin.  Because of the mild     episode of pancreatitis, I did not stop her medications.  This     could be secondary to these medications or idiopathic.  Recommend     if the patient has another episode of pancreatitis, to stop these     medications. 3. Diabetes mellitus type 2.  The patient was continued on her  medications throughout the hospital stay.  The patient also was on     sliding scale. 4. Hypertension.  The patient on Benicar HCT that was continued     throughout the hospital stay.  Blood pressure was controlled. 5. Mild dimension.  The patient on Aricept 10 mg every evening that     was continued.  No changes were done basically to her medication.  DISCHARGE INSTRUCTIONS: 1. Activity as tolerated. 2. Diet; heart-healthy diet. 3. Disposition home.     Clydia Llano, MD     ME/MEDQ  D:  07/19/2010  T:  07/19/2010  Job:  [id number]  Electronically Signed by Clydia Llano  on 07/28/2010 06:49:16 PM

## 2010-08-30 LAB — CBC
HCT: 35.4 % — ABNORMAL LOW (ref 36.0–46.0)
HCT: 38.4 % (ref 36.0–46.0)
Hemoglobin: 11.7 g/dL — ABNORMAL LOW (ref 12.0–15.0)
Hemoglobin: 13.1 g/dL (ref 12.0–15.0)
MCH: 30.8 pg (ref 26.0–34.0)
MCHC: 34.1 g/dL (ref 30.0–36.0)
MCV: 90.4 fL (ref 78.0–100.0)
MCV: 91 fL (ref 78.0–100.0)
RBC: 3.89 MIL/uL (ref 3.87–5.11)
RBC: 4.25 MIL/uL (ref 3.87–5.11)
RDW: 12.7 % (ref 11.5–15.5)
WBC: 7.9 10*3/uL (ref 4.0–10.5)

## 2010-08-30 LAB — COMPREHENSIVE METABOLIC PANEL
ALT: 16 U/L (ref 0–35)
AST: 17 U/L (ref 0–37)
Alkaline Phosphatase: 92 U/L (ref 39–117)
CO2: 23 mEq/L (ref 19–32)
Calcium: 9.8 mg/dL (ref 8.4–10.5)
Chloride: 105 mEq/L (ref 96–112)
GFR calc Af Amer: >60 mL/min (ref >60)
GFR calc non Af Amer: >60 mL/min (ref >60)
Glucose, Bld: 139 mg/dL — ABNORMAL HIGH (ref 70–99)
Potassium: 3.6 mEq/L (ref 3.5–5.1)
Sodium: 141 mEq/L (ref 135–145)

## 2010-08-30 LAB — URINALYSIS, ROUTINE W REFLEX MICROSCOPIC
Bilirubin Urine: NEGATIVE
Nitrite: NEGATIVE
Specific Gravity, Urine: 1.016 (ref 1.005–1.030)
Urobilinogen, UA: 0.2 mg/dL (ref 0.0–1.0)
pH: 6 (ref 5.0–8.0)

## 2010-08-30 LAB — DIFFERENTIAL
Basophils Relative: 0 % (ref 0–1)
Eosinophils Absolute: 0.3 10*3/uL (ref 0.0–0.7)
Eosinophils Relative: 3 % (ref 0–5)
Lymphs Abs: 2.5 10*3/uL (ref 0.7–4.0)
Monocytes Relative: 6 % (ref 3–12)
Neutrophils Relative %: 68 % (ref 43–77)

## 2010-08-30 LAB — URINE CULTURE: Culture  Setup Time: 201112211452

## 2010-08-30 LAB — PROTIME-INR
INR: 1 (ref 0.00–1.49)
Prothrombin Time: 13.4 seconds (ref 11.6–15.2)

## 2010-08-30 LAB — BASIC METABOLIC PANEL
BUN: 8 mg/dL (ref 6–23)
Chloride: 111 mEq/L (ref 96–112)
GFR calc Af Amer: >60 mL/min (ref >60)
GFR calc non Af Amer: >60 mL/min (ref >60)
Potassium: 3.7 mEq/L (ref 3.5–5.1)
Sodium: 143 mEq/L (ref 135–145)

## 2010-08-30 LAB — GLUCOSE, CAPILLARY: Glucose-Capillary: 129 mg/dL — ABNORMAL HIGH (ref 70–99)

## 2010-08-30 LAB — LACTIC ACID, PLASMA: Lactic Acid, Venous: 1.3 mmol/L (ref 0.5–2.2)

## 2010-08-30 LAB — TROPONIN I: Troponin I: 0.02 ng/mL (ref 0.00–0.06)

## 2010-08-30 LAB — LIPASE, BLOOD: Lipase: 191 U/L — ABNORMAL HIGH (ref 11–59)

## 2010-08-30 LAB — HEMOGLOBIN A1C: Mean Plasma Glucose: 137 mg/dL — ABNORMAL HIGH (ref <117)

## 2010-08-30 LAB — APTT: aPTT: 28 seconds (ref 24–37)

## 2010-08-31 LAB — CBC
HCT: 37.9 % (ref 36.0–46.0)
MCHC: 34.7 g/dL (ref 30.0–36.0)
Platelets: 263 10*3/uL (ref 150–400)
RDW: 13.2 % (ref 11.5–15.5)

## 2010-08-31 LAB — DIFFERENTIAL
Lymphs Abs: 1.5 10*3/uL (ref 0.7–4.0)
Monocytes Relative: 7 % (ref 3–12)
Neutro Abs: 5.8 10*3/uL (ref 1.7–7.7)
Neutrophils Relative %: 68 % (ref 43–77)

## 2010-08-31 LAB — COMPREHENSIVE METABOLIC PANEL
Albumin: 3.6 g/dL (ref 3.5–5.2)
BUN: 21 mg/dL (ref 6–23)
Calcium: 9.6 mg/dL (ref 8.4–10.5)
Glucose, Bld: 134 mg/dL — ABNORMAL HIGH (ref 70–99)
Sodium: 142 mEq/L (ref 135–145)
Total Protein: 7.6 g/dL (ref 6.0–8.3)

## 2010-09-01 LAB — URINALYSIS, ROUTINE W REFLEX MICROSCOPIC
Bilirubin Urine: NEGATIVE
Bilirubin Urine: NEGATIVE
Glucose, UA: NEGATIVE mg/dL
Glucose, UA: NEGATIVE mg/dL
Hgb urine dipstick: NEGATIVE
Ketones, ur: NEGATIVE mg/dL
Ketones, ur: NEGATIVE mg/dL
Leukocytes, UA: NEGATIVE
Nitrite: NEGATIVE
Nitrite: POSITIVE — AB
Protein, ur: 30 mg/dL — AB
Protein, ur: NEGATIVE mg/dL
Specific Gravity, Urine: 1.019 (ref 1.005–1.030)
Specific Gravity, Urine: 1.024 (ref 1.005–1.030)
Urobilinogen, UA: 0.2 mg/dL (ref 0.0–1.0)
Urobilinogen, UA: 0.2 mg/dL (ref 0.0–1.0)
pH: 6 (ref 5.0–8.0)
pH: 6.5 (ref 5.0–8.0)

## 2010-09-01 LAB — DIFFERENTIAL
Basophils Absolute: 0 10*3/uL (ref 0.0–0.1)
Basophils Absolute: 0.1 K/uL (ref 0.0–0.1)
Basophils Relative: 1 % (ref 0–1)
Eosinophils Absolute: 0.5 K/uL (ref 0.0–0.7)
Eosinophils Relative: 5 % (ref 0–5)
Eosinophils Relative: 6 % — ABNORMAL HIGH (ref 0–5)
Lymphocytes Relative: 20 % (ref 12–46)
Lymphocytes Relative: 24 % (ref 12–46)
Lymphs Abs: 1.7 K/uL (ref 0.7–4.0)
Lymphs Abs: 2.1 10*3/uL (ref 0.7–4.0)
Monocytes Absolute: 0.5 10*3/uL (ref 0.1–1.0)
Monocytes Absolute: 0.5 K/uL (ref 0.1–1.0)
Monocytes Relative: 5 % (ref 3–12)
Monocytes Relative: 6 % (ref 3–12)
Neutro Abs: 5.6 10*3/uL (ref 1.7–7.7)
Neutro Abs: 6 K/uL (ref 1.7–7.7)
Neutrophils Relative %: 69 % (ref 43–77)

## 2010-09-01 LAB — URINE MICROSCOPIC-ADD ON

## 2010-09-01 LAB — CBC
HCT: 36.4 % (ref 36.0–46.0)
HCT: 37.6 % (ref 36.0–46.0)
Hemoglobin: 12.5 g/dL (ref 12.0–15.0)
Hemoglobin: 12.7 g/dL (ref 12.0–15.0)
MCH: 31 pg (ref 26.0–34.0)
MCH: 31.1 pg (ref 26.0–34.0)
MCHC: 33.8 g/dL (ref 30.0–36.0)
MCHC: 34.4 g/dL (ref 30.0–36.0)
MCV: 90.5 fL (ref 78.0–100.0)
MCV: 91.6 fL (ref 78.0–100.0)
Platelets: 222 K/uL (ref 150–400)
Platelets: 224 K/uL (ref 150–400)
RBC: 4.02 MIL/uL (ref 3.87–5.11)
RBC: 4.1 MIL/uL (ref 3.87–5.11)
RDW: 13.3 % (ref 11.5–15.5)
RDW: 13.3 % (ref 11.5–15.5)
WBC: 8.6 K/uL (ref 4.0–10.5)
WBC: 8.7 K/uL (ref 4.0–10.5)

## 2010-09-01 LAB — WET PREP, GENITAL
Trich, Wet Prep: NONE SEEN
WBC, Wet Prep HPF POC: NONE SEEN

## 2010-09-01 LAB — BASIC METABOLIC PANEL WITH GFR
BUN: 15 mg/dL (ref 6–23)
CO2: 26 meq/L (ref 19–32)
Calcium: 9.4 mg/dL (ref 8.4–10.5)
Chloride: 106 meq/L (ref 96–112)
Creatinine, Ser: 0.63 mg/dL (ref 0.4–1.2)
GFR calc Af Amer: >60 mL/min (ref >60)
GFR calc non Af Amer: >60 mL/min (ref >60)
Glucose, Bld: 119 mg/dL — ABNORMAL HIGH (ref 70–99)
Potassium: 3.2 meq/L — ABNORMAL LOW (ref 3.5–5.1)
Sodium: 139 meq/L (ref 135–145)

## 2010-09-01 LAB — POCT I-STAT, CHEM 8
BUN: 16 mg/dL (ref 6–23)
Calcium, Ion: 1.17 mmol/L (ref 1.12–1.32)
Chloride: 107 meq/L (ref 96–112)
Creatinine, Ser: 0.6 mg/dL (ref 0.4–1.2)
Glucose, Bld: 135 mg/dL — ABNORMAL HIGH (ref 70–99)
HCT: 38 % (ref 36.0–46.0)
Hemoglobin: 12.9 g/dL (ref 12.0–15.0)
Potassium: 3.2 meq/L — ABNORMAL LOW (ref 3.5–5.1)
Sodium: 142 meq/L (ref 135–145)
TCO2: 25 mmol/L (ref 0–100)

## 2010-09-01 LAB — GC/CHLAMYDIA PROBE AMP, GENITAL
Chlamydia, DNA Probe: NEGATIVE
GC Probe Amp, Genital: NEGATIVE

## 2010-09-01 LAB — GLUCOSE, CAPILLARY: Glucose-Capillary: 118 mg/dL — ABNORMAL HIGH (ref 70–99)

## 2010-09-02 LAB — CBC
HCT: 38.4 % (ref 36.0–46.0)
MCH: 31.4 pg (ref 26.0–34.0)
MCV: 91.6 fL (ref 78.0–100.0)
Platelets: 226 10*3/uL (ref 150–400)
RDW: 13.2 % (ref 11.5–15.5)
WBC: 8.7 10*3/uL (ref 4.0–10.5)

## 2010-09-02 LAB — DIFFERENTIAL
Basophils Absolute: 0 10*3/uL (ref 0.0–0.1)
Eosinophils Absolute: 0.6 10*3/uL (ref 0.0–0.7)
Eosinophils Relative: 7 % — ABNORMAL HIGH (ref 0–5)
Lymphocytes Relative: 17 % (ref 12–46)
Monocytes Absolute: 0.5 10*3/uL (ref 0.1–1.0)

## 2010-09-02 LAB — BASIC METABOLIC PANEL
BUN: 19 mg/dL (ref 6–23)
CO2: 25 mEq/L (ref 19–32)
Chloride: 108 mEq/L (ref 96–112)
Creatinine, Ser: 0.7 mg/dL (ref 0.4–1.2)
Glucose, Bld: 125 mg/dL — ABNORMAL HIGH (ref 70–99)
Potassium: 3.9 mEq/L (ref 3.5–5.1)

## 2010-09-02 LAB — URINALYSIS, ROUTINE W REFLEX MICROSCOPIC
Bilirubin Urine: NEGATIVE
Glucose, UA: NEGATIVE mg/dL
Nitrite: NEGATIVE
Protein, ur: NEGATIVE mg/dL
pH: 6 (ref 5.0–8.0)

## 2010-09-03 LAB — DIFFERENTIAL
Eosinophils Relative: 10 % — ABNORMAL HIGH (ref 0–5)
Lymphocytes Relative: 15 % (ref 12–46)
Lymphs Abs: 1.1 10*3/uL (ref 0.7–4.0)
Monocytes Absolute: 0.3 10*3/uL (ref 0.1–1.0)
Monocytes Relative: 4 % (ref 3–12)

## 2010-09-03 LAB — CBC
HCT: 34.5 % — ABNORMAL LOW (ref 36.0–46.0)
Hemoglobin: 11.7 g/dL — ABNORMAL LOW (ref 12.0–15.0)
MCH: 31.4 pg (ref 26.0–34.0)
MCV: 92.6 fL (ref 78.0–100.0)
RBC: 3.72 MIL/uL — ABNORMAL LOW (ref 3.87–5.11)
WBC: 7.6 10*3/uL (ref 4.0–10.5)

## 2010-09-03 LAB — URINALYSIS, ROUTINE W REFLEX MICROSCOPIC
Bilirubin Urine: NEGATIVE
Hgb urine dipstick: NEGATIVE
Ketones, ur: NEGATIVE mg/dL
Specific Gravity, Urine: 1.025 (ref 1.005–1.030)
pH: 5.5 (ref 5.0–8.0)

## 2010-09-03 LAB — BASIC METABOLIC PANEL
CO2: 24 mEq/L (ref 19–32)
Chloride: 111 mEq/L (ref 96–112)
GFR calc Af Amer: >60 mL/min (ref >60)
Potassium: 3.4 mEq/L — ABNORMAL LOW (ref 3.5–5.1)

## 2010-09-03 LAB — GLUCOSE, CAPILLARY

## 2010-09-03 LAB — POCT CARDIAC MARKERS: Troponin i, poc: <0.05 ng/mL (ref 0.00–0.09)

## 2010-09-04 LAB — DIFFERENTIAL
Basophils Relative: 1 % (ref 0–1)
Lymphs Abs: 1 10*3/uL (ref 0.7–4.0)
Monocytes Absolute: 0.5 10*3/uL (ref 0.1–1.0)
Monocytes Relative: 5 % (ref 3–12)
Neutro Abs: 7.8 10*3/uL — ABNORMAL HIGH (ref 1.7–7.7)
Neutrophils Relative %: 76 % (ref 43–77)

## 2010-09-04 LAB — POCT I-STAT, CHEM 8
Calcium, Ion: 1.17 mmol/L (ref 1.12–1.32)
Chloride: 105 mEq/L (ref 96–112)
Creatinine, Ser: 1 mg/dL (ref 0.4–1.2)
Glucose, Bld: 195 mg/dL — ABNORMAL HIGH (ref 70–99)
HCT: 40 % (ref 36.0–46.0)
Potassium: 3.1 mEq/L — ABNORMAL LOW (ref 3.5–5.1)

## 2010-09-04 LAB — POCT CARDIAC MARKERS
CKMB, poc: <1 ng/mL — ABNORMAL LOW (ref 1.0–8.0)
Troponin i, poc: <0.05 ng/mL (ref 0.00–0.09)

## 2010-09-04 LAB — GLUCOSE, CAPILLARY: Glucose-Capillary: 180 mg/dL — ABNORMAL HIGH (ref 70–99)

## 2010-09-04 LAB — CBC
MCV: 90.5 fL (ref 78.0–100.0)
Platelets: 220 10*3/uL (ref 150–400)
RDW: 12.8 % (ref 11.5–15.5)
WBC: 10.3 10*3/uL (ref 4.0–10.5)

## 2010-09-05 LAB — COMPREHENSIVE METABOLIC PANEL
ALT: 17 U/L (ref 0–35)
ALT: 20 U/L (ref 0–35)
ALT: 21 U/L (ref 0–35)
AST: 15 U/L (ref 0–37)
Albumin: 3 g/dL — ABNORMAL LOW (ref 3.5–5.2)
Albumin: 3.2 g/dL — ABNORMAL LOW (ref 3.5–5.2)
Alkaline Phosphatase: 77 U/L (ref 39–117)
Alkaline Phosphatase: 91 U/L (ref 39–117)
Alkaline Phosphatase: 91 U/L (ref 39–117)
BUN: 8 mg/dL (ref 6–23)
CO2: 23 mEq/L (ref 19–32)
Calcium: 10.2 mg/dL (ref 8.4–10.5)
Chloride: 104 mEq/L (ref 96–112)
Chloride: 105 mEq/L (ref 96–112)
GFR calc Af Amer: >60 mL/min (ref >60)
GFR calc non Af Amer: 54 mL/min — ABNORMAL LOW (ref >60)
Glucose, Bld: 110 mg/dL — ABNORMAL HIGH (ref 70–99)
Potassium: 3.1 mEq/L — ABNORMAL LOW (ref 3.5–5.1)
Potassium: 4.1 mEq/L (ref 3.5–5.1)
Sodium: 137 mEq/L (ref 135–145)
Sodium: 141 mEq/L (ref 135–145)
Sodium: 142 mEq/L (ref 135–145)
Total Bilirubin: 0.2 mg/dL — ABNORMAL LOW (ref 0.3–1.2)
Total Bilirubin: 0.3 mg/dL (ref 0.3–1.2)
Total Protein: 7.3 g/dL (ref 6.0–8.3)
Total Protein: 7.8 g/dL (ref 6.0–8.3)

## 2010-09-05 LAB — DIFFERENTIAL
Basophils Absolute: 0 10*3/uL (ref 0.0–0.1)
Basophils Relative: 0 % (ref 0–1)
Basophils Relative: 0 % (ref 0–1)
Basophils Relative: 1 % (ref 0–1)
Eosinophils Absolute: 1.1 10*3/uL — ABNORMAL HIGH (ref 0.0–0.7)
Eosinophils Absolute: 1.3 10*3/uL — ABNORMAL HIGH (ref 0.0–0.7)
Eosinophils Relative: 10 % — ABNORMAL HIGH (ref 0–5)
Eosinophils Relative: 18 % — ABNORMAL HIGH (ref 0–5)
Lymphs Abs: 0.8 10*3/uL (ref 0.7–4.0)
Monocytes Absolute: 0.6 10*3/uL (ref 0.1–1.0)
Monocytes Absolute: 0.8 10*3/uL (ref 0.1–1.0)
Monocytes Relative: 5 % (ref 3–12)
Neutrophils Relative %: 79 % — ABNORMAL HIGH (ref 43–77)

## 2010-09-05 LAB — CBC
HCT: 36.9 % (ref 36.0–46.0)
HCT: 39.5 % (ref 36.0–46.0)
HCT: 39.8 % (ref 36.0–46.0)
Hemoglobin: 12.4 g/dL (ref 12.0–15.0)
Hemoglobin: 13.3 g/dL (ref 12.0–15.0)
Hemoglobin: 13.4 g/dL (ref 12.0–15.0)
MCHC: 33.3 g/dL (ref 30.0–36.0)
MCHC: 33.9 g/dL (ref 30.0–36.0)
MCHC: 34 g/dL (ref 30.0–36.0)
MCV: 90.6 fL (ref 78.0–100.0)
MCV: 90.7 fL (ref 78.0–100.0)
Platelets: 328 10*3/uL (ref 150–400)
Platelets: 376 10*3/uL (ref 150–400)
Platelets: 417 10*3/uL — ABNORMAL HIGH (ref 150–400)
RBC: 4.36 MIL/uL (ref 3.87–5.11)
RDW: 13.1 % (ref 11.5–15.5)
RDW: 13.3 % (ref 11.5–15.5)
RDW: 13.3 % (ref 11.5–15.5)
WBC: 10.7 10*3/uL — ABNORMAL HIGH (ref 4.0–10.5)
WBC: 9.8 10*3/uL (ref 4.0–10.5)

## 2010-09-05 LAB — RENAL FUNCTION PANEL
Albumin: 3.1 g/dL — ABNORMAL LOW (ref 3.5–5.2)
BUN: 22 mg/dL (ref 6–23)
Calcium: 9.8 mg/dL (ref 8.4–10.5)
Chloride: 104 mEq/L (ref 96–112)
Creatinine, Ser: 0.93 mg/dL (ref 0.4–1.2)
GFR calc Af Amer: >60 mL/min (ref >60)

## 2010-09-05 LAB — BLOOD GAS, ARTERIAL
Acid-base deficit: 2.9 mmol/L — ABNORMAL HIGH (ref 0.0–2.0)
Bicarbonate: 21.4 mEq/L (ref 20.0–24.0)
O2 Saturation: 97.6 %
Patient temperature: 98.6
TCO2: 19.5 mmol/L (ref 0–100)
pO2, Arterial: 137 mmHg — ABNORMAL HIGH (ref 80.0–100.0)

## 2010-09-05 LAB — BASIC METABOLIC PANEL
BUN: 25 mg/dL — ABNORMAL HIGH (ref 6–23)
CO2: 20 mEq/L (ref 19–32)
Calcium: 9.4 mg/dL (ref 8.4–10.5)
Glucose, Bld: 180 mg/dL — ABNORMAL HIGH (ref 70–99)
Sodium: 136 mEq/L (ref 135–145)

## 2010-09-05 LAB — HEPATIC FUNCTION PANEL: Bilirubin, Direct: 0.1 mg/dL (ref 0.0–0.3)

## 2010-09-05 LAB — GLUCOSE, CAPILLARY
Glucose-Capillary: 107 mg/dL — ABNORMAL HIGH (ref 70–99)
Glucose-Capillary: 109 mg/dL — ABNORMAL HIGH (ref 70–99)
Glucose-Capillary: 116 mg/dL — ABNORMAL HIGH (ref 70–99)
Glucose-Capillary: 116 mg/dL — ABNORMAL HIGH (ref 70–99)
Glucose-Capillary: 121 mg/dL — ABNORMAL HIGH (ref 70–99)
Glucose-Capillary: 136 mg/dL — ABNORMAL HIGH (ref 70–99)

## 2010-09-05 LAB — URINE MICROSCOPIC-ADD ON

## 2010-09-05 LAB — STOOL CULTURE

## 2010-09-05 LAB — GIARDIA/CRYPTOSPORIDIUM SCREEN(EIA): Cryptosporidium Screen (EIA): NEGATIVE

## 2010-09-05 LAB — URINALYSIS, ROUTINE W REFLEX MICROSCOPIC
Hgb urine dipstick: NEGATIVE
Nitrite: NEGATIVE
Protein, ur: NEGATIVE mg/dL
Protein, ur: NEGATIVE mg/dL
Specific Gravity, Urine: 1.016 (ref 1.005–1.030)
Specific Gravity, Urine: 1.017 (ref 1.005–1.030)
Urobilinogen, UA: 0.2 mg/dL (ref 0.0–1.0)
Urobilinogen, UA: 0.2 mg/dL (ref 0.0–1.0)

## 2010-09-05 LAB — POCT CARDIAC MARKERS
CKMB, poc: 1.1 ng/mL (ref 1.0–8.0)
Myoglobin, poc: 64.7 ng/mL (ref 12–200)

## 2010-09-07 LAB — POCT I-STAT, CHEM 8
BUN: 25 mg/dL — ABNORMAL HIGH (ref 6–23)
Creatinine, Ser: 0.9 mg/dL (ref 0.4–1.2)
Hemoglobin: 13.3 g/dL (ref 12.0–15.0)
Potassium: 3.9 mEq/L (ref 3.5–5.1)
Sodium: 141 mEq/L (ref 135–145)

## 2010-09-07 LAB — DIFFERENTIAL
Lymphocytes Relative: 12 % (ref 12–46)
Lymphs Abs: 1.2 10*3/uL (ref 0.7–4.0)
Neutro Abs: 7.7 10*3/uL (ref 1.7–7.7)
Neutrophils Relative %: 72 % (ref 43–77)

## 2010-09-07 LAB — CBC
HCT: 37.7 % (ref 36.0–46.0)
Hemoglobin: 12.6 g/dL (ref 12.0–15.0)
MCHC: 33.3 g/dL (ref 30.0–36.0)
MCV: 93.2 fL (ref 78.0–100.0)
RBC: 4.05 MIL/uL (ref 3.87–5.11)
RDW: 14.2 % (ref 11.5–15.5)

## 2010-09-07 LAB — POCT CARDIAC MARKERS
CKMB, poc: <1 ng/mL — ABNORMAL LOW (ref 1.0–8.0)
Myoglobin, poc: 60.6 ng/mL (ref 12–200)

## 2010-09-08 LAB — BASIC METABOLIC PANEL WITH GFR
CO2: 25 meq/L (ref 19–32)
Calcium: 9.6 mg/dL (ref 8.4–10.5)
Creatinine, Ser: 0.78 mg/dL (ref 0.4–1.2)
GFR calc Af Amer: >60 mL/min (ref >60)
GFR calc non Af Amer: >60 mL/min (ref >60)
Glucose, Bld: 125 mg/dL — ABNORMAL HIGH (ref 70–99)

## 2010-09-08 LAB — URINALYSIS, ROUTINE W REFLEX MICROSCOPIC
Bilirubin Urine: NEGATIVE
Glucose, UA: NEGATIVE mg/dL
Hgb urine dipstick: NEGATIVE
Ketones, ur: NEGATIVE mg/dL
Ketones, ur: NEGATIVE mg/dL
Ketones, ur: NEGATIVE mg/dL
Nitrite: NEGATIVE
Protein, ur: NEGATIVE mg/dL
Protein, ur: NEGATIVE mg/dL
Protein, ur: NEGATIVE mg/dL
Urobilinogen, UA: 0.2 mg/dL (ref 0.0–1.0)
Urobilinogen, UA: 1 mg/dL (ref 0.0–1.0)
Urobilinogen, UA: 1 mg/dL (ref 0.0–1.0)

## 2010-09-08 LAB — BASIC METABOLIC PANEL
BUN: 11 mg/dL (ref 6–23)
BUN: 15 mg/dL (ref 6–23)
CO2: 27 mEq/L (ref 19–32)
Calcium: 9.3 mg/dL (ref 8.4–10.5)
Chloride: 101 mEq/L (ref 96–112)
Chloride: 101 mEq/L (ref 96–112)
Creatinine, Ser: 0.78 mg/dL (ref 0.4–1.2)
Creatinine, Ser: 0.85 mg/dL (ref 0.4–1.2)
GFR calc Af Amer: >60 mL/min (ref >60)
Glucose, Bld: 106 mg/dL — ABNORMAL HIGH (ref 70–99)
Potassium: 3.2 mEq/L — ABNORMAL LOW (ref 3.5–5.1)
Potassium: 3.7 mEq/L (ref 3.5–5.1)
Sodium: 137 mEq/L (ref 135–145)

## 2010-09-08 LAB — URINE CULTURE: Colony Count: >=100000

## 2010-09-08 LAB — CBC
HCT: 36.5 % (ref 36.0–46.0)
HCT: 37.1 % (ref 36.0–46.0)
Hemoglobin: 12.8 g/dL (ref 12.0–15.0)
MCHC: 33.5 g/dL (ref 30.0–36.0)
MCHC: 34.5 g/dL (ref 30.0–36.0)
MCV: 88.9 fL (ref 78.0–100.0)
MCV: 89.6 fL (ref 78.0–100.0)
Platelets: 269 10*3/uL (ref 150–400)
Platelets: 288 10*3/uL (ref 150–400)
Platelets: 328 10*3/uL (ref 150–400)
RBC: 3.96 MIL/uL (ref 3.87–5.11)
RBC: 4.17 MIL/uL (ref 3.87–5.11)
RDW: 13.9 % (ref 11.5–15.5)
RDW: 14.4 % (ref 11.5–15.5)
RDW: 14.9 % (ref 11.5–15.5)
WBC: 11.5 10*3/uL — ABNORMAL HIGH (ref 4.0–10.5)
WBC: 12.1 10*3/uL — ABNORMAL HIGH (ref 4.0–10.5)

## 2010-09-08 LAB — DIFFERENTIAL
Basophils Absolute: 0 10*3/uL (ref 0.0–0.1)
Basophils Absolute: 0 10*3/uL (ref 0.0–0.1)
Basophils Absolute: 0 K/uL (ref 0.0–0.1)
Basophils Relative: 0 % (ref 0–1)
Basophils Relative: 0 % (ref 0–1)
Basophils Relative: 0 % (ref 0–1)
Eosinophils Absolute: 0.6 10*3/uL (ref 0.0–0.7)
Eosinophils Absolute: 0.6 10*3/uL (ref 0.0–0.7)
Eosinophils Absolute: 1.3 10*3/uL — ABNORMAL HIGH (ref 0.0–0.7)
Eosinophils Relative: 11 % — ABNORMAL HIGH (ref 0–5)
Eosinophils Relative: 5 % (ref 0–5)
Eosinophils Relative: 6 % — ABNORMAL HIGH (ref 0–5)
Lymphocytes Relative: 17 % (ref 12–46)
Lymphs Abs: 1.3 10*3/uL (ref 0.7–4.0)
Lymphs Abs: 2.1 10*3/uL (ref 0.7–4.0)
Monocytes Absolute: 0.5 10*3/uL (ref 0.1–1.0)
Monocytes Absolute: 0.6 10*3/uL (ref 0.1–1.0)
Monocytes Relative: 5 % (ref 3–12)
Neutro Abs: 6.1 10*3/uL (ref 1.7–7.7)
Neutro Abs: 8.8 K/uL — ABNORMAL HIGH (ref 1.7–7.7)
Neutrophils Relative %: 71 % (ref 43–77)
Neutrophils Relative %: 73 % (ref 43–77)

## 2010-09-08 LAB — GLUCOSE, CAPILLARY
Glucose-Capillary: 123 mg/dL — ABNORMAL HIGH (ref 70–99)
Glucose-Capillary: 138 mg/dL — ABNORMAL HIGH (ref 70–99)

## 2010-09-08 LAB — LACTIC ACID, PLASMA
Lactic Acid, Venous: 1.2 mmol/L (ref 0.5–2.2)
Lactic Acid, Venous: 1.5 mmol/L (ref 0.5–2.2)

## 2010-09-08 LAB — URINE MICROSCOPIC-ADD ON

## 2010-09-08 LAB — CK TOTAL AND CKMB (NOT AT ARMC)
Relative Index: INVALID (ref 0.0–2.5)
Total CK: 49 U/L (ref 7–177)

## 2010-09-10 ENCOUNTER — Encounter: Payer: Self-pay | Admitting: Internal Medicine

## 2010-09-13 ENCOUNTER — Ambulatory Visit: Payer: Medicare Other | Admitting: Internal Medicine

## 2010-09-14 ENCOUNTER — Ambulatory Visit: Payer: Medicare Other | Admitting: Internal Medicine

## 2010-09-20 LAB — COMPREHENSIVE METABOLIC PANEL
ALT: 17 U/L (ref 0–35)
ALT: 17 U/L (ref 0–35)
AST: 15 U/L (ref 0–37)
AST: 17 U/L (ref 0–37)
Albumin: 2.9 g/dL — ABNORMAL LOW (ref 3.5–5.2)
Alkaline Phosphatase: 81 U/L (ref 39–117)
BUN: 10 mg/dL (ref 6–23)
CO2: 27 mEq/L (ref 19–32)
CO2: 28 mEq/L (ref 19–32)
Calcium: 9 mg/dL (ref 8.4–10.5)
Calcium: 9.2 mg/dL (ref 8.4–10.5)
Chloride: 108 mEq/L (ref 96–112)
Creatinine, Ser: 0.53 mg/dL (ref 0.4–1.2)
GFR calc Af Amer: >60 mL/min (ref >60)
GFR calc Af Amer: >60 mL/min (ref >60)
GFR calc non Af Amer: >60 mL/min (ref >60)
Glucose, Bld: 130 mg/dL — ABNORMAL HIGH (ref 70–99)
Potassium: 4 mEq/L (ref 3.5–5.1)
Sodium: 143 mEq/L (ref 135–145)
Sodium: 143 mEq/L (ref 135–145)
Sodium: 144 mEq/L (ref 135–145)
Total Bilirubin: 0.4 mg/dL (ref 0.3–1.2)
Total Protein: 6.6 g/dL (ref 6.0–8.3)
Total Protein: 6.8 g/dL (ref 6.0–8.3)
Total Protein: 7 g/dL (ref 6.0–8.3)

## 2010-09-20 LAB — GLUCOSE, CAPILLARY
Glucose-Capillary: 114 mg/dL — ABNORMAL HIGH (ref 70–99)
Glucose-Capillary: 115 mg/dL — ABNORMAL HIGH (ref 70–99)
Glucose-Capillary: 116 mg/dL — ABNORMAL HIGH (ref 70–99)
Glucose-Capillary: 117 mg/dL — ABNORMAL HIGH (ref 70–99)
Glucose-Capillary: 123 mg/dL — ABNORMAL HIGH (ref 70–99)
Glucose-Capillary: 124 mg/dL — ABNORMAL HIGH (ref 70–99)
Glucose-Capillary: 125 mg/dL — ABNORMAL HIGH (ref 70–99)
Glucose-Capillary: 128 mg/dL — ABNORMAL HIGH (ref 70–99)
Glucose-Capillary: 128 mg/dL — ABNORMAL HIGH (ref 70–99)
Glucose-Capillary: 145 mg/dL — ABNORMAL HIGH (ref 70–99)
Glucose-Capillary: 163 mg/dL — ABNORMAL HIGH (ref 70–99)

## 2010-09-20 LAB — URINE CULTURE: Colony Count: >=100000

## 2010-09-20 LAB — CBC
HCT: 31.5 % — ABNORMAL LOW (ref 36.0–46.0)
HCT: 32.4 % — ABNORMAL LOW (ref 36.0–46.0)
HCT: 33.2 % — ABNORMAL LOW (ref 36.0–46.0)
HCT: 39.7 % (ref 36.0–46.0)
Hemoglobin: 10.8 g/dL — ABNORMAL LOW (ref 12.0–15.0)
Hemoglobin: 11 g/dL — ABNORMAL LOW (ref 12.0–15.0)
Hemoglobin: 11 g/dL — ABNORMAL LOW (ref 12.0–15.0)
Hemoglobin: 13.4 g/dL (ref 12.0–15.0)
MCHC: 33.5 g/dL (ref 30.0–36.0)
MCHC: 33.7 g/dL (ref 30.0–36.0)
MCHC: 33.9 g/dL (ref 30.0–36.0)
MCV: 91.6 fL (ref 78.0–100.0)
MCV: 91.7 fL (ref 78.0–100.0)
MCV: 91.8 fL (ref 78.0–100.0)
MCV: 92.5 fL (ref 78.0–100.0)
Platelets: 248 10*3/uL (ref 150–400)
RBC: 3.42 MIL/uL — ABNORMAL LOW (ref 3.87–5.11)
RBC: 3.53 MIL/uL — ABNORMAL LOW (ref 3.87–5.11)
RBC: 3.57 MIL/uL — ABNORMAL LOW (ref 3.87–5.11)
RBC: 3.59 MIL/uL — ABNORMAL LOW (ref 3.87–5.11)
RBC: 4.33 MIL/uL (ref 3.87–5.11)
RDW: 12.9 % (ref 11.5–15.5)
RDW: 13.4 % (ref 11.5–15.5)
WBC: 7.6 10*3/uL (ref 4.0–10.5)
WBC: 7.7 10*3/uL (ref 4.0–10.5)
WBC: 9 10*3/uL (ref 4.0–10.5)
WBC: 9.4 10*3/uL (ref 4.0–10.5)

## 2010-09-20 LAB — DIFFERENTIAL
Basophils Absolute: 0 10*3/uL (ref 0.0–0.1)
Basophils Absolute: 0 10*3/uL (ref 0.0–0.1)
Basophils Relative: 0 % (ref 0–1)
Basophils Relative: 1 % (ref 0–1)
Eosinophils Absolute: 0.1 10*3/uL (ref 0.0–0.7)
Eosinophils Absolute: 0.8 10*3/uL — ABNORMAL HIGH (ref 0.0–0.7)
Eosinophils Absolute: 0.9 10*3/uL — ABNORMAL HIGH (ref 0.0–0.7)
Eosinophils Relative: 1 % (ref 0–5)
Eosinophils Relative: 11 % — ABNORMAL HIGH (ref 0–5)
Eosinophils Relative: 12 % — ABNORMAL HIGH (ref 0–5)
Lymphocytes Relative: 12 % (ref 12–46)
Lymphs Abs: 0.9 10*3/uL (ref 0.7–4.0)
Lymphs Abs: 1.2 10*3/uL (ref 0.7–4.0)
Monocytes Absolute: 0.4 10*3/uL (ref 0.1–1.0)
Monocytes Absolute: 0.5 10*3/uL (ref 0.1–1.0)
Monocytes Absolute: 0.5 10*3/uL (ref 0.1–1.0)
Monocytes Relative: 5 % (ref 3–12)
Monocytes Relative: 6 % (ref 3–12)
Monocytes Relative: 6 % (ref 3–12)
Monocytes Relative: 6 % (ref 3–12)
Neutro Abs: 5.7 10*3/uL (ref 1.7–7.7)
Neutrophils Relative %: 74 % (ref 43–77)

## 2010-09-20 LAB — URINALYSIS, ROUTINE W REFLEX MICROSCOPIC
Bilirubin Urine: NEGATIVE
Glucose, UA: NEGATIVE mg/dL
Glucose, UA: NEGATIVE mg/dL
Hgb urine dipstick: NEGATIVE
Ketones, ur: 15 mg/dL — AB
Ketones, ur: NEGATIVE mg/dL
Nitrite: NEGATIVE
Protein, ur: 100 mg/dL — AB
pH: 6.5 (ref 5.0–8.0)
pH: 8 (ref 5.0–8.0)

## 2010-09-20 LAB — TROPONIN I: Troponin I: 0.02 ng/mL (ref 0.00–0.06)

## 2010-09-20 LAB — BASIC METABOLIC PANEL
BUN: 7 mg/dL (ref 6–23)
Chloride: 100 mEq/L (ref 96–112)
Chloride: 102 mEq/L (ref 96–112)
GFR calc non Af Amer: >60 mL/min (ref >60)
GFR calc non Af Amer: >60 mL/min (ref >60)
Potassium: 2.7 mEq/L — CL (ref 3.5–5.1)
Potassium: 3.1 mEq/L — ABNORMAL LOW (ref 3.5–5.1)
Sodium: 139 mEq/L (ref 135–145)
Sodium: 141 mEq/L (ref 135–145)

## 2010-09-20 LAB — URINE MICROSCOPIC-ADD ON

## 2010-09-20 LAB — LIPID PANEL
Cholesterol: 134 mg/dL (ref 0–200)
HDL: 45 mg/dL (ref >39)
LDL Cholesterol: 69 mg/dL (ref 0–99)
Total CHOL/HDL Ratio: 3 RATIO
VLDL: 15 mg/dL (ref 0–40)
VLDL: 17 mg/dL (ref 0–40)

## 2010-09-20 LAB — POCT I-STAT, CHEM 8
Calcium, Ion: 1.02 mmol/L — ABNORMAL LOW (ref 1.12–1.32)
Chloride: 105 mEq/L (ref 96–112)
Glucose, Bld: 132 mg/dL — ABNORMAL HIGH (ref 70–99)
HCT: 40 % (ref 36.0–46.0)
TCO2: 29 mmol/L (ref 0–100)

## 2010-09-22 ENCOUNTER — Ambulatory Visit: Payer: Medicare Other | Admitting: Internal Medicine

## 2010-09-22 DIAGNOSIS — Z0289 Encounter for other administrative examinations: Secondary | ICD-10-CM

## 2010-09-23 LAB — POCT CARDIAC MARKERS
CKMB, poc: 1.3 ng/mL (ref 1.0–8.0)
CKMB, poc: 1.8 ng/mL (ref 1.0–8.0)
CKMB, poc: <1 ng/mL — ABNORMAL LOW (ref 1.0–8.0)
Myoglobin, poc: 68.7 ng/mL (ref 12–200)
Myoglobin, poc: 76.1 ng/mL (ref 12–200)
Troponin i, poc: <0.05 ng/mL (ref 0.00–0.09)
Troponin i, poc: <0.05 ng/mL (ref 0.00–0.09)

## 2010-09-23 LAB — CBC
HCT: 34.6 % — ABNORMAL LOW (ref 36.0–46.0)
HCT: 36.4 % (ref 36.0–46.0)
Hemoglobin: 11.8 g/dL — ABNORMAL LOW (ref 12.0–15.0)
MCHC: 34.1 g/dL (ref 30.0–36.0)
MCHC: 34.1 g/dL (ref 30.0–36.0)
MCHC: 34.6 g/dL (ref 30.0–36.0)
MCV: 92 fL (ref 78.0–100.0)
MCV: 92.4 fL (ref 78.0–100.0)
MCV: 93 fL (ref 78.0–100.0)
Platelets: 231 10*3/uL (ref 150–400)
Platelets: 240 10*3/uL (ref 150–400)
Platelets: 246 10*3/uL (ref 150–400)
RBC: 3.77 MIL/uL — ABNORMAL LOW (ref 3.87–5.11)
RBC: 3.88 MIL/uL (ref 3.87–5.11)
RDW: 13.8 % (ref 11.5–15.5)
WBC: 8.2 10*3/uL (ref 4.0–10.5)
WBC: 9.7 10*3/uL (ref 4.0–10.5)

## 2010-09-23 LAB — DIFFERENTIAL
Basophils Absolute: 0 10*3/uL (ref 0.0–0.1)
Basophils Relative: 0 % (ref 0–1)
Basophils Relative: 0 % (ref 0–1)
Eosinophils Absolute: 0.1 10*3/uL (ref 0.0–0.7)
Eosinophils Absolute: 0.1 10*3/uL (ref 0.0–0.7)
Eosinophils Relative: 1 % (ref 0–5)
Lymphs Abs: 1.3 10*3/uL (ref 0.7–4.0)
Lymphs Abs: 2.1 10*3/uL (ref 0.7–4.0)
Monocytes Relative: 4 % (ref 3–12)
Monocytes Relative: 5 % (ref 3–12)
Monocytes Relative: 7 % (ref 3–12)
Neutro Abs: 4.8 10*3/uL (ref 1.7–7.7)
Neutro Abs: 5.6 10*3/uL (ref 1.7–7.7)
Neutrophils Relative %: 65 % (ref 43–77)
Neutrophils Relative %: 68 % (ref 43–77)
Neutrophils Relative %: 81 % — ABNORMAL HIGH (ref 43–77)

## 2010-09-23 LAB — URINALYSIS, ROUTINE W REFLEX MICROSCOPIC
Bilirubin Urine: NEGATIVE
Glucose, UA: NEGATIVE mg/dL
Glucose, UA: NEGATIVE mg/dL
Hgb urine dipstick: NEGATIVE
Hgb urine dipstick: NEGATIVE
Ketones, ur: NEGATIVE mg/dL
Protein, ur: NEGATIVE mg/dL
Specific Gravity, Urine: 1.007 (ref 1.005–1.030)
Urobilinogen, UA: 0.2 mg/dL (ref 0.0–1.0)
pH: 6.5 (ref 5.0–8.0)

## 2010-09-23 LAB — BASIC METABOLIC PANEL
BUN: 12 mg/dL (ref 6–23)
BUN: 12 mg/dL (ref 6–23)
CO2: 30 mEq/L (ref 19–32)
Calcium: 9.2 mg/dL (ref 8.4–10.5)
Calcium: 9.4 mg/dL (ref 8.4–10.5)
Chloride: 103 mEq/L (ref 96–112)
Creatinine, Ser: 0.66 mg/dL (ref 0.4–1.2)
Creatinine, Ser: 0.73 mg/dL (ref 0.4–1.2)
GFR calc Af Amer: >60 mL/min (ref >60)
GFR calc Af Amer: >60 mL/min (ref >60)
GFR calc non Af Amer: >60 mL/min (ref >60)

## 2010-09-23 LAB — POCT I-STAT, CHEM 8
BUN: 13 mg/dL (ref 6–23)
Creatinine, Ser: 0.6 mg/dL (ref 0.4–1.2)
Glucose, Bld: 180 mg/dL — ABNORMAL HIGH (ref 70–99)
Potassium: 3.4 mEq/L — ABNORMAL LOW (ref 3.5–5.1)
Sodium: 140 mEq/L (ref 135–145)

## 2010-09-23 LAB — COMPREHENSIVE METABOLIC PANEL
BUN: 12 mg/dL (ref 6–23)
Calcium: 9.3 mg/dL (ref 8.4–10.5)
Creatinine, Ser: 0.68 mg/dL (ref 0.4–1.2)
Glucose, Bld: 154 mg/dL — ABNORMAL HIGH (ref 70–99)
Total Protein: 6.5 g/dL (ref 6.0–8.3)

## 2010-09-23 LAB — GLUCOSE, CAPILLARY
Glucose-Capillary: 112 mg/dL — ABNORMAL HIGH (ref 70–99)
Glucose-Capillary: 112 mg/dL — ABNORMAL HIGH (ref 70–99)
Glucose-Capillary: 119 mg/dL — ABNORMAL HIGH (ref 70–99)
Glucose-Capillary: 121 mg/dL — ABNORMAL HIGH (ref 70–99)
Glucose-Capillary: 137 mg/dL — ABNORMAL HIGH (ref 70–99)
Glucose-Capillary: 145 mg/dL — ABNORMAL HIGH (ref 70–99)
Glucose-Capillary: 151 mg/dL — ABNORMAL HIGH (ref 70–99)

## 2010-09-23 LAB — IRON AND TIBC
Iron: 67 ug/dL (ref 42–135)
Saturation Ratios: 21 % (ref 20–55)
TIBC: 318 ug/dL (ref 250–470)
UIBC: 251 ug/dL

## 2010-09-23 LAB — CARDIAC PANEL(CRET KIN+CKTOT+MB+TROPI)
CK, MB: 1.4 ng/mL (ref 0.3–4.0)
Relative Index: INVALID (ref 0.0–2.5)
Relative Index: INVALID (ref 0.0–2.5)
Total CK: 65 U/L (ref 7–177)
Total CK: 72 U/L (ref 7–177)
Troponin I: 0.01 ng/mL (ref 0.00–0.06)
Troponin I: <0.01 ng/mL (ref 0.00–0.06)
Troponin I: <0.01 ng/mL (ref 0.00–0.06)

## 2010-09-23 LAB — URINE CULTURE: Colony Count: 45000

## 2010-09-23 LAB — FERRITIN: Ferritin: 64 ng/mL (ref 10–291)

## 2010-09-23 LAB — RPR: RPR Ser Ql: NONREACTIVE

## 2010-09-23 LAB — LIPID PANEL
Cholesterol: 185 mg/dL (ref 0–200)
Total CHOL/HDL Ratio: 3.6 RATIO
VLDL: 28 mg/dL (ref 0–40)

## 2010-09-23 LAB — METHYLMALONIC ACID(MMA), RND URINE

## 2010-09-23 LAB — PROTIME-INR: INR: 0.89 (ref 0.00–1.49)

## 2010-09-23 LAB — VITAMIN B12: Vitamin B-12: >2000 pg/mL — ABNORMAL HIGH (ref 211–911)

## 2010-09-30 LAB — URINE CULTURE: Colony Count: >=100000

## 2010-09-30 LAB — URINE MICROSCOPIC-ADD ON

## 2010-09-30 LAB — URINALYSIS, ROUTINE W REFLEX MICROSCOPIC
Bilirubin Urine: NEGATIVE
Ketones, ur: NEGATIVE mg/dL
Nitrite: POSITIVE — AB
Protein, ur: NEGATIVE mg/dL
pH: 6 (ref 5.0–8.0)

## 2010-09-30 LAB — GLUCOSE, CAPILLARY: Glucose-Capillary: 140 mg/dL — ABNORMAL HIGH (ref 70–99)

## 2010-10-05 NOTE — H&P (Signed)
NAMEJENAVEE, [name] NO.:  [id number]  MEDICAL RECORD NO.:  [id number]          PATIENT TYPE:  INP  LOCATION:  0104                         [facility]ACILITY:  [facility]  PHYSICIAN:  Lonia Blood, M.D.      DATE OF BIRTH:  1927/07/24  DATE OF ADMISSION:  07/14/2010 DATE OF DISCHARGE:                             HISTORY & PHYSICAL   PRIMARY CARE PHYSICIAN:  Robyn N. Allyne Gee, M.D.  PRESENTING COMPLAINT:  Abdominal pain.  HISTORY OF PRESENT ILLNESS:  The patient is an 77 year old female with mild dementia who presented with epigastric pain, but complaining of chest pressure.  On questioning, her pressure is mainly in the epigastric region rather than the chest.  It is associated with some nausea and vomiting, multiple times yesterday and 1 episode today.  The patient is status post cholecystectomy many years ago.  Denied any fever.  Denied any diarrhea.  No constipation, no melena, no bright red blood per rectum.  She denied any shortness of breath.  The patient is here with her daughter who also gave history on behalf of the patient.  PAST MEDICAL HISTORY: 1. COPD. 2. Dementia. 3. Diabetes type 2. 4. Hypertension. 5. Asthma. 6. Anxiety disorder. 7. Osteoarthritis. 8. GERD.  PAST SURGICAL HISTORY:  She is status post cholecystectomy.  ALLERGIES: 1. CODEINE. 2. MORPHINE. 3. PENICILLIN.  CURRENT MEDICATIONS: 1. Aricept 10 mg daily. 2. Aspirin 81 mg daily. 3. Janumet 1 tablet daily. 4. Nexium 40 mg daily. 5. Singulair 10 mg daily. 6. Vitamin B12, 1000 mcg daily. 7. Donepezil 1 tablet daily. 8. Tylenol as needed for pain.  SOCIAL HISTORY:  The patient is widowed.  She lives with her daughter. No tobacco, alcohol, or IV drug use.  She has mild dementia, but for the most part able to do her ADLs with minimal assistance.  FAMILY HISTORY:  CHF in her sister and coronary artery disease in her father.  REVIEW OF SYSTEMS:  All systems reviewed are currently  negative except per HPI.  PHYSICAL EXAMINATION:  VITAL SIGNS:  Her temperature is 97.9, blood pressure 125/75, pulse 88, respiratory rate 20, and sats 99% on room air. GENERAL:  She is awake, alert, oriented, pleasant, remarkably, fully oriented.  She is in no acute distress.  HEENT:  PERRL.  EOMI.  No pallor, no jaundice, no rhinorrhea. NECK:  Supple.  No JVD, no lymphadenopathy. RESPIRATORY:  She has good air entry bilaterally.  No wheezes, no rales, no crackles. CARDIOVASCULAR:  She has S1, S2.  No audible murmur. ABDOMEN:  Soft, full with positive bowel sounds.  Some epigastric tenderness. EXTREMITIES:  No edema, cyanosis, or clubbing. SKIN:  No rashes or ulcers.  LABORATORY DATA:  White count is 9.2, hemoglobin 13.3, platelets 239 with normal differential.  Sodium is 138, potassium 3.9, chloride 104, CO2 is 23, glucose 173, BUN 19, creatinine 0.69, the rest of LFTs within normal with albumin of 3.8.  Her lipase is 215, was previously 59 on June 10, 2010.  Initial cardiac markers are negative.  Urinalysis showed cloudy urine with trace leukocyte esterase and WBCs 3-6 with many bacteria.  Chest/abdominal x-ray shows hyperextension  without acute cardiopulmonary disease, no specific nonobstructive bowel gas pattern.  ASSESSMENT:  This is an 77 year old female with a prior history of cholecystectomy, presenting with acute pancreatitis.  The cause is not entirely clear.  She has already had her gallbladder removed, so less likely to be gallstones.  No new medications as far as the patient is concerned.  She is not an alcoholic, but she is a diabetic.  Her cholesterol level is unknown at this point, so it could be hypertriglyceridemia, although it needs to be very high to cause this level of pancreatitis.  The patient seems to be doing much better at this point compared to when she first came in.  PLAN: 1. Acute pancreatitis.  Admit the patient, bowel rest, saline      hydration, and mainly supportive care.  Further followup will be in     the morning with lipase.  If she is much better and stops vomiting,     we will start her on clear liquids and advance her diet.  I will     probably empirically check an ultrasound of the right upper     quadrant even though it is less likely to show much.  CT abdomen     will be the next option if she is not improving tremendously.  She     may have really passed the stone despite the fact that she had no     gallbladder. 2. Dementia.  This is mild and the patient is able to communicate     adequately.  We will continue with Aricept once she starts eating. 3. Hypertension.  Blood pressure seems reasonable. 4. Diabetes.  I will start sliding scale insulin and hold her oral     medications until she is able to start oral intake. 5. History of COPD/asthma.  I will empirically put her on nebulizers     in the hospital p.r.n. 6. GERD.  I will put the patient on IV Protonix for now until she is     able to take orally. 7. Possible UTI.  I will start her also on empiric treatment for UTI,     although this could just be asymptomatic bacteriuria due to her     age.  Further treatment will depend on the patient's response to these initial measures.     Lonia Blood, M.D.     Verlin Grills  D:  07/14/2010  T:  07/15/2010  Job:  [id number]  Electronically Signed by Lonia Blood M.D. on 10/05/2010 03:28:36 PM

## 2010-10-08 IMAGING — CR DG KNEE COMPLETE 4+V*R*
4 series · 4 of 4 positions shown · non-contrast
Comparison: None.

CLINICAL DATA: Fell and injured right knee.

RIGHT KNEE - COMPLETE 4+ VIEW [id number]:

[view not recorded (1 of 4)]
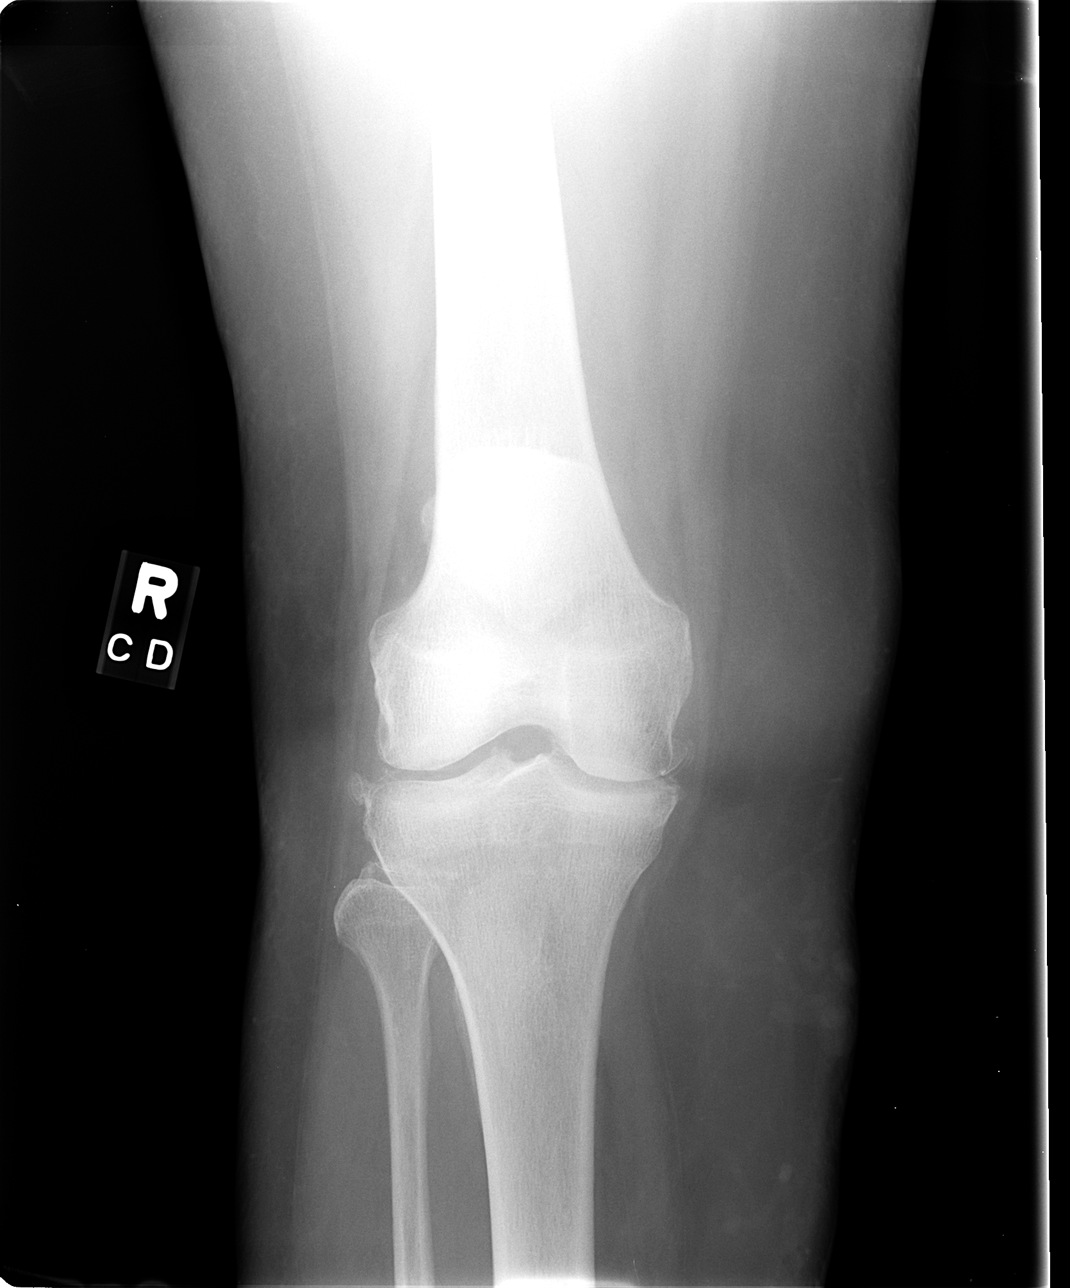

[view not recorded (2 of 4)]
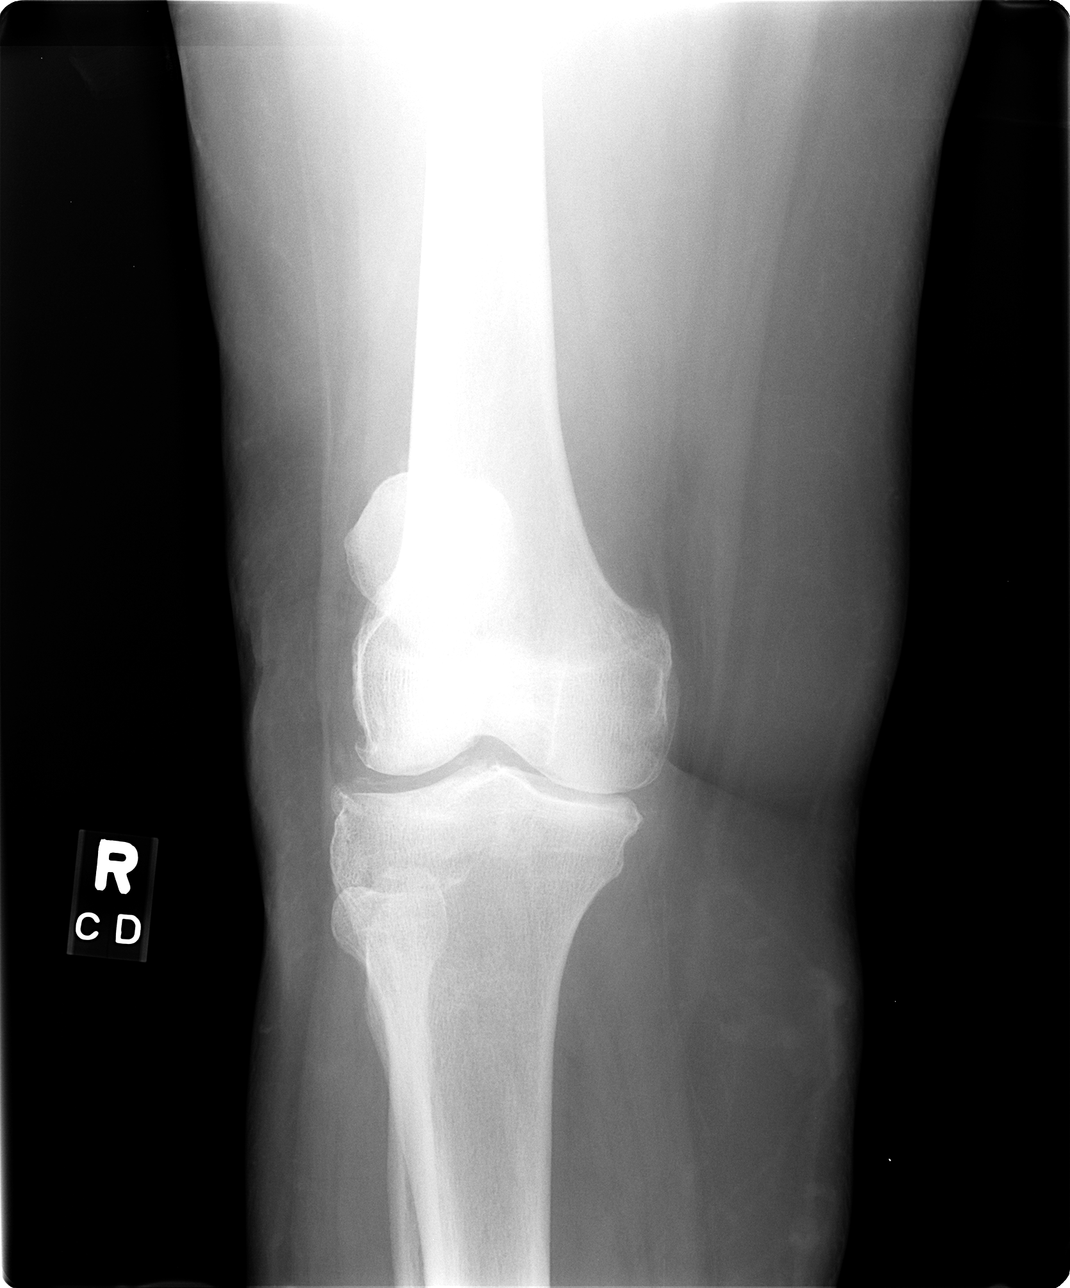

[view not recorded (3 of 4)]
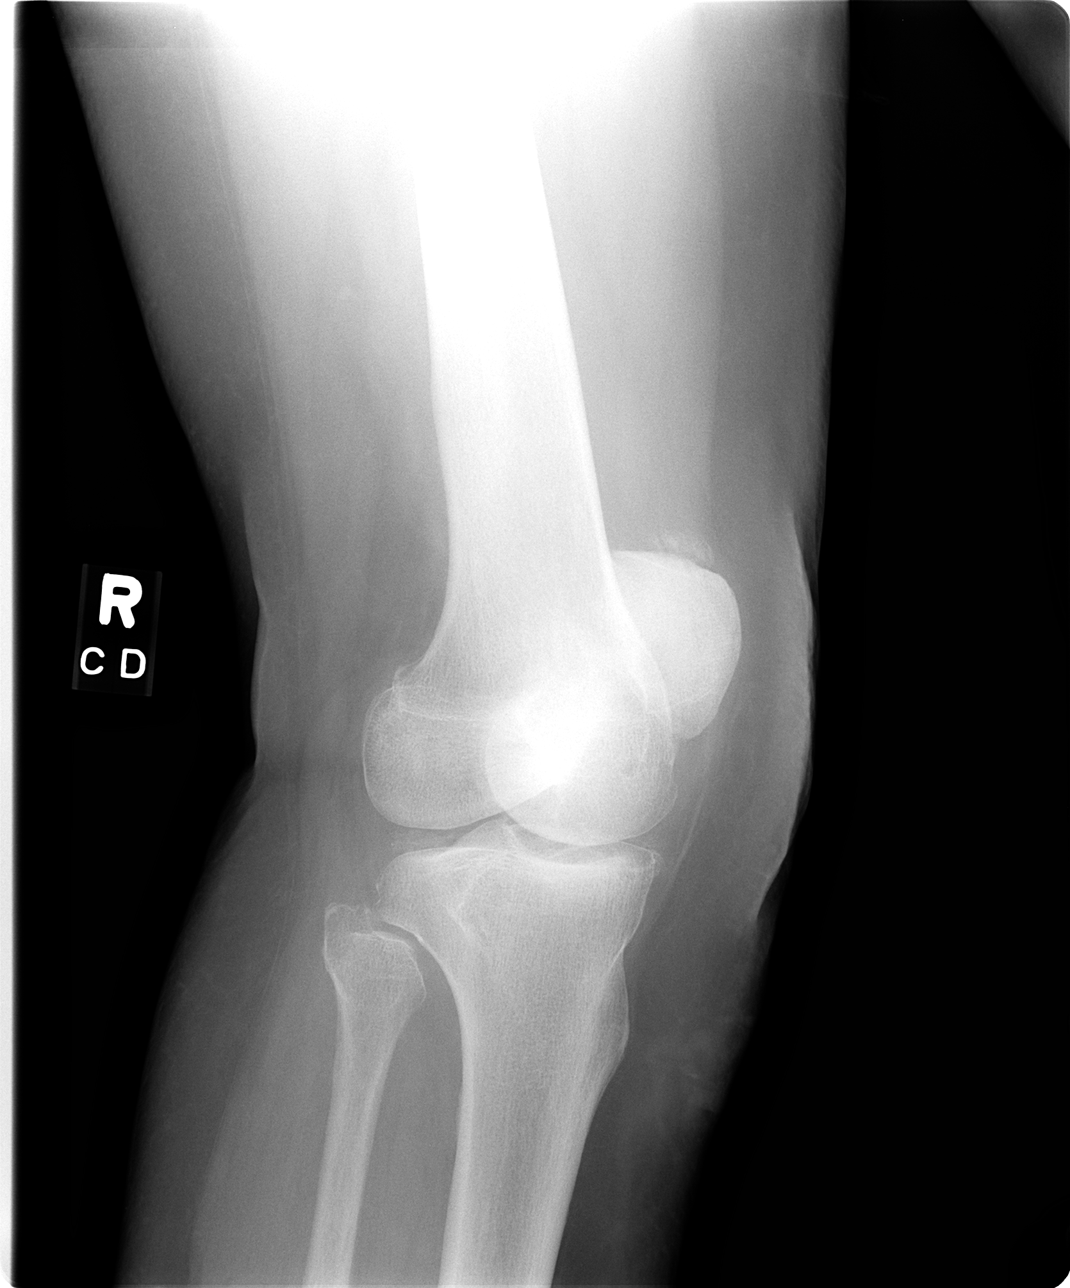

[view not recorded (4 of 4)]
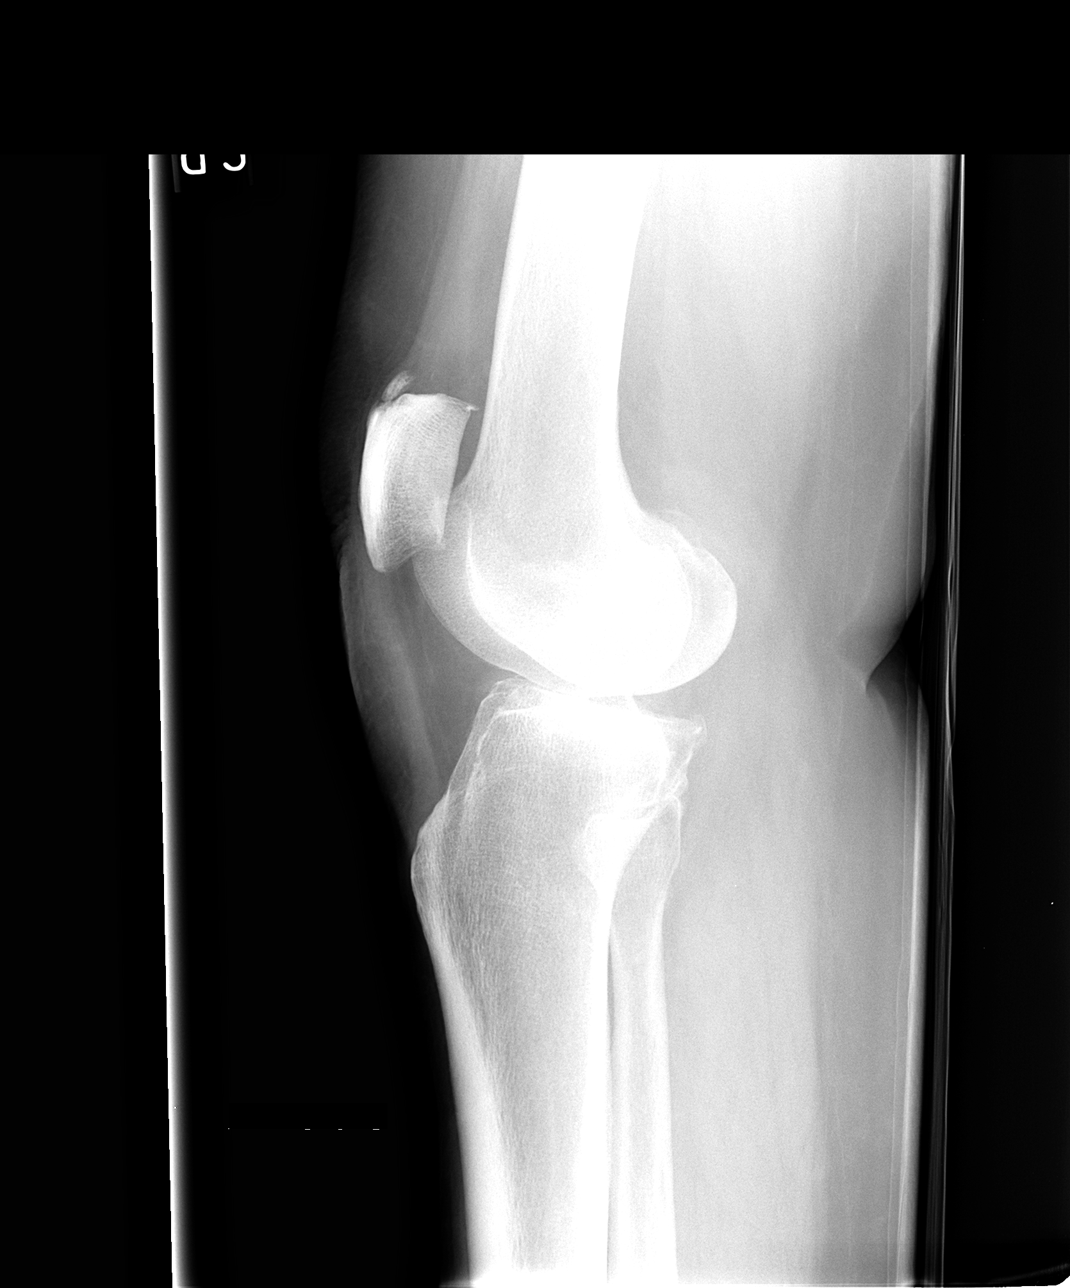

[4 of 4 positions shown; findings below may reference images not displayed]

FINDINGS: No evidence of acute fracture or dislocation.  Medial and
lateral compartment joint space narrowing with associated
hypertrophic spurring.  Chondrocalcinosis in the lateral meniscus.
Calcification at the insertion of the quadriceps tendon on the
superior patella.  Well-preserved bone mineral density for age.  No
evidence of a significant joint effusion.
IMPRESSION: No acute skeletal abnormalities.  Moderate osteoarthritis secondary
to CPPD.

## 2010-11-02 NOTE — Consult Note (Signed)
NEW PATIENT CONSULTATION   [name], [name]  DOB:  01-Jun-1928                                       01/07/2010  AVWUJ#:[id number]   I saw the patient in the office today in consultation to evaluate her  for her peripheral vascular disease.  She was referred by Dr. Elvin So.  She has a hammertoe on her left second toe and is being evaluated for  surgery on this.  She has had pain in both feet for many months which  came on gradually.  There are no real aggravating factors.  She  describes burning pain in both feet.  Her symptoms are alleviated  somewhat with elevation.  Her activities are fairly limited but I do not  get any history of claudication, rest pain or nonhealing ulcers.   Her past medical history is significant for diabetes and hypertension.  She denies any history of hypercholesterolemia, history of previous  myocardial infarction, history of congestive heart failure or history of  COPD.   SOCIAL HISTORY:  She is widowed.  She has 7 children.  She has a remote  history of tobacco use.   FAMILY HISTORY:  There is no history of premature cardiovascular  disease.   REVIEW OF SYSTEMS:  CARDIOVASCULAR:  She has occasional chest pressure  which has been stable.  She admits to some dyspnea on exertion.  She has  had no palpitations.  She has had no history of stroke, TIAs or  amaurosis fugax.  She has had no history of DVT or phlebitis.  PULMONARY:  She does have a history of asthma and wheezing.  GU:  She has had urinary frequency.  MUSCULOSKELETAL:  She does have generalized arthritis.   PHYSICAL EXAMINATION:  General:  This is a pleasant 77 year old woman  who appears her stated age.  Vital signs:  Blood pressure is 115/74,  heart rate is 70, saturation 99%.  HEENT:  Unremarkable.  Lungs:  Are  clear bilaterally to auscultation.  Cardiovascular:  I do not detect any  carotid bruits.  She has a regular rate and rhythm.  She has palpable  femoral  pulses and palpable dorsalis pedis pulses bilaterally.  Abdomen:  Soft and nontender with normal pitched bowel sounds.  Musculoskeletal:  She has a hammertoe deformity of her left second toe.  She has no open  ulcers.  Neurologic:  She has no focal weakness or paresthesias.   I did order and independently interpret her arterial Doppler study which  shows biphasic Doppler signals in the dorsalis pedis and posterior  tibial position bilaterally.  She has ABIs of 100% bilaterally.  Toe  pressure on the right is 87 mmHg and on the left 99 mmHg.   Based on the Doppler study she has no evidence of significant  infrainguinal arterial occlusive disease.  She should have adequate  circulation to heal any surgery that she has performed on her foot.  I  will be happy to see her back if any new issues or vascular issues  arise.     Di Kindle. Edilia Bo, M.D.  Electronically Signed   CSD/MEDQ  D:  01/07/2010  T:  01/08/2010  Job:  [id number]   cc:   Candyce Churn. Allyne Gee, M.D.  Merwyn Katos, DPM

## 2010-11-05 NOTE — H&P (Signed)
NAME:  ESPARANZA, KRIDER NO.:  [id number]   MEDICAL RECORD NO.:  [id number]                   PATIENT TYPE:  AMB   LOCATION:  DAY                                  FACILITY:  [facility]   PHYSICIAN:  Adolph Pollack, M.D.            DATE OF BIRTH:  1928/01/08   DATE OF ADMISSION:  10/31/2003  DATE OF DISCHARGE:                                HISTORY & PHYSICAL   REASON FOR ADMISSION:  Elective cholecystectomy and removal of pelvic mass.   HISTORY OF PRESENT ILLNESS:  Ms. Beery is a 77 year old female whom I  first saw October 14, 2003.  She was having some problems with epigastric  fullness and bloating as well as nausea.  She had some weight loss and her  appetite had declined.  While in the hospital at that time, she underwent a  CT scan demonstrating a large right ovarian mass and also a left pelvic  abnormality.  There also appeared to be a stone in the common bile duct or  at the cystic bile duct junction.  There is abnormality in or near the  gallbladder as well.  Appeared to be in the fundus.  Initially she denied  pain.  Transvaginal pelvic ultrasound was done as well as abdominal  ultrasound.  The abdominal ultrasound again demonstrated the mass and some  cholelithiasis.  Pelvic ultrasound demonstrated what appeared to be a cystic  septated type mass which may have been a hydrosalpinx.  She is admitted now  for the above operations.  Of note was the CEA, CA19-9 and CA125 were all  normal.   PAST MEDICAL HISTORY:  Hypertension.   PREVIOUS OPERATIONS:  Hysterectomy.   ALLERGIES:  PENICILLIN.   MEDICATIONS:  Include Zestoretic, Protonix, Reglan, Premarin, Valium p.r.n.   SOCIAL HISTORY:  No current tobacco use and she briefly used it just in the  past, no alcohol use.   FAMILY HISTORY:  No history of GI or GYN malignancy.   PHYSICAL EXAMINATION:  GENERAL:  A well-developed, well-nourished female.  Appears younger than her stated age.  HEENT:   Eyes:  Extraocular motions intact.  No icterus.  NECK:  Supple without masses or obvious thyroid enlargement.  RESPIRATORY:  Breath sounds equal and clear.  Respirations are unlabored.  CARDIOVASCULAR:  Demonstrates a regular rate and rhythm with a soft systolic  murmur.  No JVD noted.  ABDOMEN:  Soft with a lower midline scar present.  No tenderness.  No  masses.  No palpable organomegaly.  MUSCULOSKELETAL:  Good range of motion.  Good muscle tone present.   LABORATORY DATA:  Hemoglobin is 11.1, white count normal.  Liver function  tests are not elevated.   Chest x-ray demonstrates some left basilar atelectasis question pneumonia.   IMPRESSION:  1. Gallbladder mass and cholelithiasis.  2. Cystic pelvic mass.  3. Hypertension.   PLAN:  Laparoscopic, possible open, cholecystectomy with cholangiogram  pending the frozen  section results.  Doctors Clearance Coots and Antionette Char  will then proceed with laparotomy and removal of the pelvic tumor.                                               Adolph Pollack, M.D.    Kari Baars  D:  10/31/2003  T:  10/31/2003  Job:  [id number]   cc:   Jarome Matin, M.D.  [address] [place]  [place] [address]  Fax: [phone]   Roseanna Rainbow, M.D.

## 2010-11-05 NOTE — Op Note (Signed)
[name], [name] NO.:  [id number]   MEDICAL RECORD NO.:  [id number]          PATIENT TYPE:  AMB   LOCATION:  SDS                          FACILITY:  [facility]   PHYSICIAN:  Salley Scarlet., M.D.DATE OF BIRTH:  03-05-1928   DATE OF PROCEDURE:  05/26/2005  DATE OF DISCHARGE:  05/26/2005                                 OPERATIVE REPORT   PREOPERATIVE DIAGNOSIS:  Immature cataract, left eye.   POSTOPERATIVE DIAGNOSIS:  Immature cataract, left eye.   OPERATION:  Kelman phacoemulsification cataract, left eye.   ANESTHESIA:  Local using Xylocaine 2%, Marcaine 0.75%, Wydase.   JUSTIFICATION FOR PROCEDURE:  This is a 77 year old lady who complained of  blurring of vision with difficulty seeing to read.  She was evaluated and  found to have a visual acuity best corrected to 22/40 on the right, 20/60 on  the left.  There were bilateral immature cataracts, worse on the left than  the right.  Cataract extraction with intraocular lens implantation was  recommended.  She is admitted at this time for the purpose of that  procedure.   Under the influence of IV sedation, an [name] and akinesia retrobulbar  anesthesia were given.  The patient was prepped and draped in the usual  manner.  The lid speculum was inserted under the upper lower lid of the left  eye and a 4-0 silk traction suture was passed through the belly of the  superior rectus muscle.  A fornix-based conjunctival flap was turned.  Hemostasis was achieved using cautery.  An incision was made in the scleral  limbus and slightly down into clear cornea using crescent blade.  A stab  port incision was made at the 1:30 o'clock position.  Ocucoat was then  injected into the eye through the stab port incision.  The anterior chamber  was then entered through the corneal scleral tunnel incision at 11:30  o'clock position.  An anterior capsulotomy was then done using a bent 25  gauge needle.  The nucleus was  hydrodissected with Xylocaine.  The KPE  handpiece was passed into the anterior  chamber and the nucleus was  emulsified without difficulty.  The residual cortical material was  aspirated.  The posterior capsule was polished using an olive-tip polisher.  The wound was widened slightly to accommodate a foldable intraocular lens.  This was inserted into the eye behind the iris without difficulty.  The  anterior chamber was reformed and the pupil was constricted using Miochol.  The residual Ocucoat was then aspirated from the eye.  The lips of the wound  were hydrated and tested to make sure that there was no leak.  After  ascertaining that there was no leak, the conjunctiva was closed over the  wound using thermal cautery.  One mL Celestone and 0.5 mL _of gentamycin  werfe injected subconjunctivally.  Maxitrol ophthalmic ointment along with  pilocarpine ointment were applied along with a patch and Fox shield.  The  patient tolerated the procedure well and was discharged to the post  anesthesia recovery room in satisfactory condition.  She was instructed to  rest today, to take Vicodin every four hours as needed for pain and see me  in the office tomorrow for further evaluation.   DISCHARGE DIAGNOSIS:  Immature cataract, left eye.      Salley Scarlet., M.D.  Electronically Signed     TB/MEDQ  D:  05/27/2005  T:  05/27/2005  Job:  [id number]

## 2010-11-05 NOTE — Op Note (Signed)
NAME:  [name], [name] NO.:  [id number]   MEDICAL RECORD NO.:  [id number]                   PATIENT TYPE:  OBV   LOCATION:  5621                                 FACILITY:  [facility]   PHYSICIAN:  Adolph Pollack, M.D.            DATE OF BIRTH:  1927/07/07   DATE OF PROCEDURE:  10/31/2003  DATE OF DISCHARGE:                                 OPERATIVE REPORT   PREOPERATIVE DIAGNOSES:  Gallbladder mass and cholelithiasis.   POSTOPERATIVE DIAGNOSES:  Gallbladder mass and cholelithiasis with chronic  inflammatory changes and a benign mass on frozen section.   PROCEDURE:  Laparoscopic cholecystectomy with intraoperative cholangiogram.   SURGEON:  Adolph Pollack, M.D.   ASSISTANT:  Sheppard Plumber. Earlene Plater, M.D.   ANESTHESIA:  General.   INDICATIONS FOR PROCEDURE:  This 77 year old female was admitted to the  hospital with some abdominal pain and found to have a gallbladder mass as  well as cholelithiasis and potential gallstone in the cystic duct.  She also  has a pelvic mass. She was asymptomatic in the hospital and now presents  back for a coordinated procedure between the gynecologist and myself.   TECHNIQUE:  She was seen in the holding area, then brought to the operating  room, placed supine on the operating table and a general anesthetic was  administered. The abdomen is sterilely prepped and draped.  Local anesthetic  was infiltrated in the subumbilical region and a small subumbilical incision  was made through a previous hysterectomy scar, carried down through the  fascia and peritoneum. Omentum was directly underneath the peritoneum and  this was brushed away bluntly. A Hasson trocar was introduced into the  peritoneal cavity and pneumoperitoneum created by insufflation of the CO2  gas.   Next, the laparoscope was introduced. The patient was placed in reverse  Trendelenburg position, the right side tilted slightly upward. An 11 mm  trocar was  placed through an epigastric incision and two 5 mm trocars placed  in the right mid abdomen. Omental adhesions of the fundus and the  gallbladder were noted and these were taken down with electrocautery and  blunt dissection.  The fundus was then grasped and there was noted to be a  firm mass there. It was retracted to the right shoulder. I mobilized the  infundibulum with blunt dissection and cautery.  I identified the cystic  artery and what appeared to be inflamed cystic duct lymph node.  I isolated  the cystic artery, clipped it and divided it. I then used blunt dissection  to isolate the cystic duct and noticed the fullness in the mid cystic duct  which I milked up and appeared to be a stone.  I then clamped the cystic  duct just at the cystic duct gallbladder junction with a clip. A small  incision was made in the cystic duct. Bile was milked back. A Cholangiocath  was passed into the cystic duct  and a cholangiogram was performed.   Using  real-time fluoroscopy dilute contrast material was injected into the  cystic duct which was of moderate length.  The common hepatic, right and  left hepatic and common bile ducts were all pacified and the contrast  drained into the duodenum.  I had a preliminary reading by radiology and no  nonobstructive lesions or strictures were noted in the distal bile duct.   I then removed the Cholangiocath, the cystic duct was clipped three times  staying inside and divided sharply. The gallbladder was then dissected free  from the liver bed intact with electrocautery. It was put in an Endopouch  bag. I then copiously irrigated out the perihepatic area and noted that  hemostasis was adequate in the gallbladder fossa.  I did not note any bile  leakage.   The gallbladder was then removed to the subumbilical port in the Endopouch  bag and sent to pathology for frozen section. Frozen section came back  benign lesion consistent with some inflammatory changes.   Following this,  the trocars were removed and the pneumoperitoneum was released. The skin  incisions with the two 5 mm trocars and epigastric trocar were then closed  with 4-0 Monocryl subcuticular stitches.  Dr. Clearance Coots and Dr. Ilene Qua-  Christell Constant then came in to perform the exploratory laparotomy through a lower  midline incision.                                               Adolph Pollack, M.D.    [name]  D:  10/31/2003  T:  10/31/2003  Job:  [id number]   cc:   Anselmo Rod, M.D.  [address]  [address]  [place]  [place] [address]  Fax: [phone]   Bing Neighbors. Clearance Coots, M.D.   Roseanna Rainbow, M.D.   Jarome Matin, M.D.  [address] [place]  [place] [address]  Fax: [phone]

## 2010-11-05 NOTE — Consult Note (Signed)
NAMEMarland Kitchen  [name], [name] NO.:  [id number]   MEDICAL RECORD NO.:  [id number]          PATIENT TYPE:  INP   LOCATION:  5018                         FACILITY:  [facility]   PHYSICIAN:  Lauretta I. Odogwu, M.D.DATE OF BIRTH:  05-02-1928   DATE OF CONSULTATION:  DATE OF DISCHARGE:                                   CONSULTATION   REASON FOR CONSULTATION:  Thrombocytopenia.   Patient is a very pleasant 77 year old who looks younger than her age, who  presented to the emergency room on August 03, 2004 with a skin rash of a  few days' duration.  The patient felt that she had an allergic reaction to  one of her medicines.  She also thought that she took a double dose of one  of her medicines, which include Nexium, Zoloft, and blood pressure  medication.  She also noted slight rectal bleeding.  Admission CBC notes a  white cell count of 8.8, hemoglobin and hematocrit of 11.3 and 32.5,  respectively, platelets of 13,000.  Patient's platelet count further fell to  5000.  She was given two doses of Solu-Medrol with marked improvement of her  rash.   General review of systems is negative for neurological changes.  Her history  is negative for fever, chills, urinary or respiratory symptoms.   PAST MEDICAL HISTORY:  1.  Hypertension.  2.  GERD.  3.  Gastritis esophagitis.  4.  Status post cholecystectomy on Oct 31, 2003.  5.  History of pelvic mass resection, thought to be a paratubal cyst.   ALLERGIES:  Patient is allergic to PENICILLIN, CODEINE, and REGLAN.   CURRENT MEDICATIONS:  1.  Protonix 40 mg daily.  2.  Tylenol p.r.n.  3.  Guaifenesin.  4.  Solu-Medrol 125 mg q.12h.   SOCIAL HISTORY:  Patient is a widow.  Has seven children.  Denies a history  of tobacco or alcohol abuse.   FAMILY HISTORY:  Negative for hematological disorders.   PHYSICAL EXAMINATION:  VITAL SIGNS:  Pulse 96, blood pressure 139/73,  temperature 96.7, respirations 18.  GENERAL:  Patient is  alert and oriented x 3.  HEENT:  Head is normocephalic and atraumatic.  Sclerae are anicteric.  Extraocular muscles are intact.  Pupils are equal, round and reactive to  light.  SKIN:  No evidence of bruising; however, the patient has had petechiae  involving both upper and lower extremities.  RESPIRATORY:  Good air entry bilaterally.  Clear to auscultation.  CARDIOVASCULAR:  First and second heart sounds are present.  No added sounds  or murmurs.  ABDOMEN:  Soft and nontender with no hepatosplenomegaly.  Bowel sounds were  present.  EXTREMITIES:  No edema.  Pulses were present and symmetrical.   Labs dated August 05, 2003:  White cell count 15.2, hemoglobin 11.5,  hematocrit 33.7, platelets 17,000, previously 5000.  PT 13.9, INR 1.1, PTT  32.  D-dimer 1.63.  LDH 160.   Sodium 136, potassium 3.3, chloride 102, CO2 25, BUN 18, creatinine 0.6,  glucose 184, total bilirubin 1.2, alkaline phosphatase 92.   Review of peripheral smear revealed few giant platlets.  The red blood cell  and white blood cell morphology were normal.  There was no evidence of  blasts or schistocytes.  There was a slight left shift.  \   IMPRESSION/PLAN:  [name] is a pleasant 77 year old woman with no  significant past medical history, who presents with thrombocytopenia.  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  Smear reveals  normal morphology with decreased platelets but no blasts.  The left shift is  secondary to steroids.  Platelet count is improving with steroids. Would DC  IV Solu-Medrol and begin the patient on prednisone 1 mg/kg daily, which  works out to 60 mg daily.  The elderly have notoriously slow increments of  platelets, so this improvement could take a while.  I also suggest you check  daily CBCs.  Will also try and marrow her, either tomorrow or Friday.  She  is in full agreement.   Will also check liver/spleen ultrasound to determine  the size.   Thank you for this consult.  Will follow.      LIO/MEDQ  D:  08/04/2004  T:  08/04/2004  Job:  [id number]   cc:   Jarome Matin, M.D.  [address] [place]  [place] [address]  Fax: [phone]

## 2010-11-05 NOTE — Discharge Summary (Signed)
NAMEMarland Kitchen  [name], [name] NO.:  [id number]   MEDICAL RECORD NO.:  [id number]          PATIENT TYPE:  INP   LOCATION:  5018                         FACILITY:  [facility]   PHYSICIAN:  Jarome Matin, M.D.DATE OF BIRTH:  January 14, 1928   DATE OF ADMISSION:  08/03/2004  DATE OF DISCHARGE:  08/09/2004                                 DISCHARGE SUMMARY   HISTORY OF PRESENT ILLNESS:  The patient was admitted with some type of  reaction on her skin.  She is a 77 year old [place] American female.  She  was started on Solu-Medrol 125 mg.  I thought maybe she had a drug reaction.  However, she had a severe drop in her platelets.  We continued her on the  Solu-Medrol.  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  We could not relate the  thrombocytopenia to any particular drug.  But since she improved on the Solu-  Medrol, it was decided we keep her on it.  Temperature 96.8, pulse 69,  respirations 20, blood pressure 127/70.  Initial platelet count was 13  before she was put on the Solu-Medrol.  Hematology felt also there was bone  marrow that was consistent with TP, and there was no abnormality that stuck  out.  Gradually, we transferred her from Solu-Medrol to prednisone, and  platelets were gradually coming up.  The patient was feeling better.  Platelet count was up to 142,000.  The patient also developed some thrush in  her mouth, and she was put on Diflucan.  At the time of discharge, appetite  was good, platelets were 303,000, and the rash had cleared.  We were going  to decrease her steroids over the next two weeks once discharged.  Will keep  her on Diflucan for about 7 more days.  The patient is to see Hematology in  about two weeks and to my office in about 2-3 weeks.   At the time of discharge, temperature was 98, pulse 78, respirations 20,  blood pressure 139/80.  She was discharged home on her medications.  Dr.  __________ was the hematologist who helped with this case.      CEF/MEDQ  D:  10/06/2004  T:  10/06/2004  Job:  [id number]

## 2010-11-05 NOTE — Consult Note (Signed)
NAME:  [name], [name] NO.:  [id number]   MEDICAL RECORD NO.:  [id number]                   PATIENT TYPE:  INP   LOCATION:  5502                                 FACILITY:  [facility]   PHYSICIAN:  Adolph Pollack, M.D.            DATE OF BIRTH:  Sep 09, 1927   DATE OF CONSULTATION:  10/14/2003  DATE OF DISCHARGE:                                   CONSULTATION   REASON FOR CONSULTATION:  Epigastric pain, cholelithiasis, question  choledocholithiasis.   HISTORY OF PRESENT ILLNESS:  [name] is a 77 year old female who for the  past couple of months has had some problems with some epigastric fullness  and bloating as well as nausea. Appetite has declined and she has had some  weight loss. She was initially started on some Nexium by Dr. Bascom Levels and  this improved her symptoms. However, over the past weekend her pain worsened  and is mostly in the periumbilical region. She subsequently was seen in the  emergency room and was admitted. Of note was that she had a significant  leukocytosis at the time of admission. Her symptoms have improved fairly  significantly for the most part. Her white blood cell count has also  returned to normal. She underwent a CT scan. This demonstrated a large,  right ovarian mass and also a left pelvic abnormality, question left ovarian  mass or enlarged lymph node. There is also what appeared to be a possible  stone in the common bile duct or at the cystic duct, bile duct junction.  There is an abnormality near or in the gallbladder as well and  cholelithiasis. She feels fairly fine now, denies any pain. She has been  written to have abdominal, pelvic, and transvaginal ultrasounds and these  are pending at this time.   PAST MEDICAL HISTORY:  Hypertension.   PAST SURGICAL HISTORY:  Hysterectomy.   ALLERGIES:  Penicillin.   CURRENT MEDICATIONS:  1. Protonix.  2. Reglan.  3. P.R.N. Phenergan.  4. P.R.N. Demerol.  5. P.R.N.  Tylox.   SOCIAL HISTORY:  She had a brief episode of tobacco use in the distant past.  No alcohol use.   FAMILY HISTORY:  No history of a malignancy in the family.   REVIEW OF SYSTEMS:  CARDIOVASCULAR:  No chest pain, no known heart disease.  PULMONARY:  No dyspnea. No asthma, pneumonia. GI:  No peptic ulcer disease.  No hepatitis, no diverticulitis. She has had a change in her stool habits in  that her stools are more loose now. GU:  No kidney stones. No dysuria or  hematuria. GYN:  No vaginal discharge, no pelvic pain, no vaginal bleeding.  NEUROLOGIC:  No strokes or seizures. ENDOCRINE:  No thyroid disease, high  cholesterol, or diabetes. HEMATOLOGIC:  No known bleeding source or blood  clots.   PHYSICAL EXAMINATION:  GENERAL:  A well-developed, well-nourished female who  appears younger than her stated age.  She is afebrile.  VITAL SIGNS:  Vital signs appear to be within normal limits.  HEENT:  Eyes:  Extraocular motion is intact. No icterus.  NECK:  Supple without masses or obvious thyroid enlargement.  RESPIRATORY:  The breath sounds are equal and clear and the respirations are  nonlabored.  CARDIOVASCULAR:  Heart demonstrates a regular rate and rhythm with a soft  systolic murmur. No JVD noted. Strong peripheral pulses noted.  ABDOMEN:  Soft with a lower midline scar. It is nontender, nondistended. No  palpable masses noted. No organomegaly noted.  MUSCULOSKELETAL:  She has good range of motion and good muscle tone present.  NEUROLOGIC:  She is alert, awake, and oriented and answers questions  appropriately.   LABORATORY DATA:  Liver function tests demonstrate a low albumin of 3.0, but  no elevations. Lipase normal. Yesterday's hemoglobin was 10.5 with a white  blood cell count of 8.9. Total CK was elevated on the 24th to 331. Today's  hepatic function panel was well as a CA125 is pending.   IMPRESSION:  1. Biliary abnormality - question whether she has choledocholithiasis  and a     partially obstructing picture that has been developing over time. Thus,     not allowing her to have significant liver function test elevation if any     at all. She also has an area that appears to be either close to or in the     gallbladder, question mass.  2. Large right ovarian mass and left pelvic abnormality, question     malignancy.   PLAN:  I agree with the multiple ultrasounds and a gynecology consultation.  We will add a CEA and CA-19-9. Once we get all of the data back we will  formulate a plan as to whether she needs potential ERCP to see if she has a  partial obstruction or biliary duct tumor. She may indeed end up needing  exploratory laparotomy with combined general surgery and gynecologic  procedures.                                               Adolph Pollack, M.D.    Kari Baars  D:  10/14/2003  T:  10/14/2003  Job:  [id number]   cc:   Anselmo Rod, M.D.  [address]  [address]  [place]  [place] [address]  Fax: [phone]   Jarome Matin, M.D.  [address] [place]  [place] [address]  Fax: [phone]

## 2010-11-05 NOTE — Consult Note (Signed)
NAME:  [name], [name] NO.:  [id number]   MEDICAL RECORD NO.:  [id number]                   PATIENT TYPE:  INP   LOCATION:  5502                                 FACILITY:  [facility]   PHYSICIAN:  Anselmo Rod, M.D.               DATE OF BIRTH:  May 25, 1928   DATE OF CONSULTATION:  10/14/2003  DATE OF DISCHARGE:                                   CONSULTATION   REASON FOR CONSULTATION:  Abdominal pain, etiology unclear.   ASSESSMENT:  1. Epigastric with periumbilical pain off and on for the last three years,     dilated ducts on CAT scan, with questionable stones in the gallbladder     and a questionable mass in the gallbladder fossa with focal thickening of     the gallbladder wall, rule out chronic cholecystitis.  2. Right ovarian mass, rule out malignancy.  3. Hypertension for the last five years.  4. Status post abdominal hysterectomy several years ago.  5. History of seven __________  in the remote past.   RECOMMENDATIONS:  1. Gynecological and surgical evaluation as soon as possible.  2. Check CA-125, CEA __________  levels.  3. Change Protonix to p.o. form IV.  4. Pelvic and transvaginal ultrasound along with an abdominal ultrasound to     be done today as discussed with Dr. Cristi Loron.   DISCUSSION:  [name] is a very pleasant 77 year old African-  American female admitted to [facility] on October 11, 2003, when she  presented with acute abdominal pain, nausea, and vomiting.  The patient says  that, for the last three years, she has had a gnawing epigastric discomfort  that seems to come on periodically.  This seems to be worse postprandially.  She saw Dr. Bascom Levels in his office a couple of months ago and was given  Nexium which helped her symptoms somewhat.  However, she described a 20-  pound weight loss with decreased appetite over the last two months.  She has  tried Ensure, but this seems to cause worsening of  her nausea.  There is no  history of melena or hematochezia, fever, chills, or rigors.  She was  admitted on October 11, 2003, when her white count was elevated at 19,000;  however, this normalized shortly thereafter.  She denies any radiation of  the pain to her back.  There is no history of ulcer, jaundice, colitis,  hemorrhoids, or fissures.  There are no cardiorespiratory or genitourinary  complaints at this time.  She denies any chest pain or shortness of breath.   ALLERGIES:  No known drug allergies.   MEDICATIONS:  Reglan, Protonix, Phenergan, Demerol p.r.n., and oxycodone.   SOCIAL HISTORY:  She is a widow.  She is a retired Agricultural engineer.  She  denies the use of alcohol, tobacco, or drugs.  She lives in [place] and  has seven grandchildren.  FAMILY HISTORY:  Father died of a stroke at 26.  Her mother died of renal  failure in 43.  She has several half siblings none of whom have cancer,  diabetes, or heart disease.  Her son has AODM.   REVIEW OF SYSTEMS:  1. Epigastric and periumbilical pain.  2. No history of melena or hematochezia.  3. Nausea and vomiting, worse postprandially.   PHYSICAL EXAMINATION:  General:  An elderly pleasant African-American female  in no acute distress sitting comfortably in bed with a temperature of 98.1,  blood pressure 139/76, pulse 76, and respiratory rate of 20.  HEENT:  AT [place] __________  oral mucosa without exudate.  NECK:  Supple.  No JVD, thyromegaly, or lymphadenopathy.  CHEST:  Clear to auscultation.  S1, S2 regular.  No murmur, rub, or gallop.  LUNGS:  Lung sounds clear.  No wheezing.  ABDOMEN:  Soft with normal bowel sounds, no masses palpable.  There is a  well-healed surgical scar in the midline below the umbilicus from previous  hysterectomy.  There is tenderness in the left lower quadrant on palpation  in the periumbilical area on deep palpation.  No masses are palpated.  RECTAL:  Moderate sphincter tone and  guaiac-negative brown stools.  No other  masses palpable on digital examination.   LABORATORY:  Laboratory evaluation on admission revealed a white count of  19.4 K with a hemoglobin of 14.3, hematocrit 41.8, and MCV of 88.1.  White  count today is 8.9 K, hemoglobin 10.5, hematocrit 30.4, platelets 319.  Sodium 135, potassium 3.4, chloride 102, CO2 25, BUN 4, creatinine 0.7,  glucose 114.  Total bilirubin 0.5, alkaline phosphatase 62, AST and ALT 20  and 12 respectively.  Lipase 22 and amylase 88.  Albumin 3 and calcium 8.6.   CT scan of the abdomen and pelvis reveals dilated bile ducts with probable  stones versus a mass in the gallbladder.  CBD is dilated to the level of the  ampulla with a questionable stone in the CBD.  CT scan of the pelvis  revealed a complex cystic mass in the pelvis on the right side measuring 8.5  x 6.7 x 5 cm.  The left ovary was not identified.  Therefore, the above-  mentioned tests have been ordered.  Further recommendations pending  followup.                                               Anselmo Rod, M.D.    JNM/MEDQ  D:  10/15/2003  T:  10/15/2003  Job:  [id number]   cc:   Jarome Matin, M.D.  [address] [place]  [place] [address]  Fax: [phone]   Adolph Pollack, M.D.  [address]  [place]  [place] [address]  Fax: [phone]   Roseanna Rainbow, M.D.   [place], M.D.

## 2010-11-05 NOTE — Op Note (Signed)
NAME:  [name], [name] NO.:  [id number]   MEDICAL RECORD NO.:  [id number]                   PATIENT TYPE:  OBV   LOCATION:  0464                                 FACILITY:  [facility]   PHYSICIAN:  Charles A. Clearance Coots, M.D.             DATE OF BIRTH:  [birth year]   DATE OF PROCEDURE:  10/31/2003  DATE OF DISCHARGE:                                 OPERATIVE REPORT   PREOPERATIVE DIAGNOSES:  Right hydrosalpinx.   POSTOPERATIVE DIAGNOSES:  Right hydrosalpinx.   PROCEDURE:  Right salpingo-oophorectomy.   SURGEON:  Charles A. Clearance Coots, M.D.   ASSISTANT:  Roseanna Rainbow, M.D.   ANESTHESIA:  General.   ESTIMATED BLOOD LOSS:  200 mL.   IV FLUIDS:  2000 mL.   URINE OUTPUT:  150 mL clear.   COMPLICATIONS:  None.   DRAINS:  Foley to gravity.   SPECIMENS:  Right round ligament, fallopian tube and ovary.   FINDINGS:  Large dilated fluid filled right fallopian tube, omental  adhesions, peritoneal adhesions.   DESCRIPTION OF PROCEDURE:  The patient was brought to the operating room for  a two stage procedure.  Laparoscopic cholecystectomy was performed by Dr.  Avel Peace. This dictation will be dictated separately by Dr.  Abbey Chatters.  Following the conclusion of the laparoscopic cholecystectomy  procedure, the exploratory laparotomy was begun.  The vertical skin incision  was made through the previous incision with the scalpel down to the fascia.  The umbilical port through the fascia was used as a guide to incise the  fascia from the entrance point of the umbilicus down inferiorly through the  previous scar.  The peritoneum was also incised along with the fascia under  visual guidance. The bowel was packed off and the O'Connor-O'Sullivan  retractor was placed. The right round ligament was then grasped with Kelly  forceps and was incised with the Bovie and the peritoneum was dissected  inferiorly towards the reflection of the urinary bladder and  dissected  superiorly up to the infundibulopelvic ligament vessels.  The adventitial  tissue was pushed down and the large vessels and the ureter were identified  and the infundibulopelvic vessels were further isolated and were doubly  clamped with Anderson forceps and was cut in between the clamps and free tie  of #0 Vicryl was placed beneath the clamp and the transfixion suture of #0  Vicryl was placed above the knot.  The peritoneal reflection of the  vesicouterine fold was further dissected away from the fallopian tube and  the tube was elevated and at that point was inadvertently ruptured. Clear  cyst fluid was expelled and this was aspirated.  The fallopian tube was then  carefully dissected away from the peritoneal surfaces and submitted to  pathology for evaluation.  The right ovary was very difficult to identify  but a small right ovary was identified and was clamped across the base,  excised and submitted to  pathology for evaluation. A transfixion suture of  #0 Vicryl was placed beneath the clamp.  Hemostasis was further obtained  from a few bleeding sites from the dissection of the fallopian tube away  from the urinary bladder serosa and this was achieved with suture ligation  of 2-0 Vicryl.  The pelvic cavity was then thoroughly irrigated with warm  saline solution.  No further bleeding from the operative sites were  observed.  An attempt was made to identify the left tube and ovary but due  to multiple adhesions in the area of the tube and ovary, these structures  could not be clearly identified safely.  A decision was made to terminate  the procedure. There was no active bleeding from any operative site. The  O'Connor-O'Sullivan retractor was removed, all packing were removed. The  abdomen was then closed as follow:  The peritoneum and fascia was closed  with a continuous suture of #0 PDS from each corner to the center,  subcutaneous tissue was thoroughly irrigated with warm  saline solution and  all areas of subcutaneous bleeding were coagulated with the Bovie. The skin  was then approximated with surgical stainless steel staples.  A sterile  bandage was applied to the incision closure.  The surgical technician  indicated that all needle, sponge and instrument counts were correct. The  patient tolerated the procedure well and was transported to the recovery  room in satisfactory condition.                                               Charles A. Clearance Coots, M.D.    CAH/MEDQ  D:  10/31/2003  T:  10/31/2003  Job:  [id number]

## 2010-11-05 NOTE — Discharge Summary (Signed)
NAME:  [name], [name] NO.:  [id number]   MEDICAL RECORD NO.:  [id number]                   PATIENT TYPE:  INP   LOCATION:  5502                                 FACILITY:  [facility]   PHYSICIAN:  Jarome Matin, M.D.            DATE OF BIRTH:  [birth year]   DATE OF ADMISSION:  10/11/2003  DATE OF DISCHARGE:  10/16/2003                                 DISCHARGE SUMMARY   ADMISSION DIAGNOSES:  1. Esophagitis with vomiting and reflux gastritis, abdominal pain.  2. The patient also had a cholelithiasis and gallstones.  3. Hypertension.   BRIEF HISTORY:  [name] is a 77 year old female with gastric pain  started on the morning of admission.  She has a history of reflux  esophagitis and gastric emptying delay.  She also has a history of  hypertension.  The patient has four grown daughters.  She has been  essentially well treatment of hypertension.   PHYSICAL EXAMINATION:  VITAL SIGNS:  Temperature 97.3, pulse 115, blood  pressure 131/71, respirations 18.  CHEST: Clear to auscultation and percussion.  ABDOMEN:  Continues to complain of epigastric pain with nausea and vomiting.  Active bowel sounds.  Pain to palpation.   Remainder of physical examination was negative.   LABORATORY AND X-RAY DATA:  White count 19.4, hemoglobin 14.3.  Increased  white count, continues to vomit.   HOSPITAL COURSE:  Admitted to hydrate patient.  We asked GI to see the  patient.  Dr. Loreta Ave saw her.  She had complex cystic mass on the right side  of her pelvis.  She had stones in the gallbladder and bile duct.  So we  decided to get GI doctor to help treat, also surgery.  Decided to get a GYN  consult.  GYN thought that she had a right-sided pelvic mass,  probably not  a neoplasm, and cholelithiasis.  Decision was to have a surgeon and  gynecologist coordinate removal of gallbladder and some pelvic surgery with  Dr. Abbey Chatters for removal of gallbladder  and Dr.  Tamela Oddi.  We  discharged the patient on medicines for the reflux, Reglan 10 mg a.c. and H1  and Nexium 40 mg daily for reflux and Zestoretic for blood pressure and  __________ for arthritis, and will coordinate surgery between GYN and  general surgery.  Tussionex for cough and Ancef 400 mg daily.  Will correct  her blood pressure as an outpatient.                                                Jarome Matin, M.D.    CEF/MEDQ  D:  01/24/2004  T:  01/24/2004  Job:  [id number]

## 2010-11-05 NOTE — Consult Note (Signed)
NAME:  [name], [name] NO.:  [id number]   MEDICAL RECORD NO.:  [id number]                   PATIENT TYPE:  INP   LOCATION:  5502                                 FACILITY:  [facility]   PHYSICIAN:  Roseanna Rainbow, M.D.         DATE OF BIRTH:  05-07-1928   DATE OF CONSULTATION:  10/14/2003  DATE OF DISCHARGE:                                   CONSULTATION   The patient is a very pleasant 77 year old para 92 African-American female  who was found to have a right-sided pelvic mass during a workup for  epigastric pain, nausea and vomiting.   HISTORY OF PRESENT ILLNESS:  The patient has a several-month history of  epigastric pain, nausea and vomiting, bloating, dyspepsia.  She also reports  a 20-pound weight loss.  She reports some improvement in the symptoms with a  proton pump inhibitor; however, the epigastric pain worsened several days  prior to this presentation.  Workup during this hospitalization has included  laboratory work with an initial white blood cell count of 19,400 which has  normalized at present at 8900.  Hemoglobin is 10.5, platelets 319,000.  She  also has a total albumin level of 3.  CEA and C19 and CA125 levels are  pending.   A CT scan on April 25 demonstrated dilated bile ducts with questionable  stones versus mass.  There was also a question of a distal common bile duct  stone.  Incidentally, a complex cystic lesion was seen in the right pelvis  measuring 8.5 x 6.7 x 5 cm.  There is no mention of ascites or  lymphadenopathy.  Chest x-ray failed to demonstrate any acute disease.  The  results of the pelvic ultrasound are pending at this point.   PAST OBSTETRICAL AND GYNECOLOGICAL HISTORY:  She has a remote history of a  total abdominal hysterectomy, likely performed during the perimenopausal or  post menopausal period.  As per the patient's history, she likely had  perimenopausal dysfunctional uterine bleed.  She denies any tumor  or  malignancy.  She is status post seven spontaneous vaginal deliveries.  In  July 2004, she had a normal mammogram.   PAST MEDICAL HISTORY:  See above.  She has a history of chronic  hypertension.   PAST SURGICAL HISTORY:  See above.   FAMILY HISTORY:  She denies any family history of cancers.   SOCIAL HISTORY:  She is widowed.  She denies any tobacco, ethanol, or  substance abuse.   MEDICATIONS:  Include Protonix, Reglan, Phenergan, Demerol, and Percocet.   PHYSICAL EXAMINATION:  VITAL SIGNS:  Temperature 98.2, blood pressure  139/76, heart rate 76, respirations 20.  GENERAL:  Thin, African-American female, in no apparent distress.  ABDOMEN:  Soft, nontender, nondistended.  PELVIC:  The external female genitalia demonstrates atrophic changes.  On  speculum exam, the vaginal mucosa has decreased rugae; however, the vagina  is clean.  On bimanual exam, there is  questionable fullness in the right  adnexa; however, the adnexa is nontender.  The left adnexa is not palpable,  nontender.  Rectovaginal exam confirms the bimanual exam.   IMPRESSION:  Right-sided pelvic mass, likely ovarian neoplasm.  Malignancy  unlikely.   PLAN:  Will review the tumor markers as well as the pelvic ultrasound.  Will  discuss this patient with GYN oncology.                                               Roseanna Rainbow, M.D.    Judee Clara  D:  10/14/2003  T:  10/14/2003  Job:  [id number]

## 2010-11-05 NOTE — Discharge Summary (Signed)
NAME:  [name], [name] NO.:  [id number]   MEDICAL RECORD NO.:  [id number]                   PATIENT TYPE:  INP   LOCATION:  0464                                 FACILITY:  [facility]   PHYSICIAN:  Charles A. Clearance Coots, M.D.             DATE OF BIRTH:  [birth year]   DATE OF ADMISSION:  10/31/2003  DATE OF DISCHARGE:  11/04/2003                                 DISCHARGE SUMMARY   ADMISSION DIAGNOSES:  1. Foreign body mass with cholelithiasis.  2. Cystic pelvic mass.   DISCHARGE DIAGNOSES:  1. Foreign body mass with cholelithiasis.  2. Cystic pelvic mass.  3. Benign pathology specimen from both gallbladder and fallopian tube.   The patient discharged home in good condition.   REASON FOR ADMISSION:  The patient is a very pleasant 77 year old black  female, para 7, who was found to have a right-sided pelvic mass during  workup for epigastric pain, nausea, and vomiting for which she was consulted  initially by general surgery, Dr. Avel Peace. GYN consultation was  requested because of the cystic right-sided pelvic mass which was thought to  be a hydrosalpinx. The patient has a history of several months of epigastric  pain, nausea, vomiting, bloating, and dyspepsia. She also reports a 20-pound  weight loss. She reported some improvement in the symptoms with a proton  pump inhibitor, however the epigastric pain worsened several days prior to  presentation to the hospital. Workup during this hospitalization included  laboratory work with an initial white blood cell count of 19,000 which  normalized during the course of hospitalization to 8900.  Her hemoglobin was  10.5 and platelets were 319,000. Total albumin level was 3. Tumor markers,  CEA, CA-19.9, and CA-125 were all normal.  CT scan on October 13, 2003,  demonstrated dilated bile ducts with questionable stones versus a mass.  There was also a question of distal common bile duct stones. A complex cyst  in  the right pelvis measured 8.5 x 6.7 x 5 cm. No evidence of ascites or  lymphadenopathy. Chest x-ray failed to demonstrate any acute disease. The  results of the pelvic ultrasound agreed with the CT scan.   PAST MEDICAL HISTORY:  Surgery: Remote history of total abdominal  hysterectomy per patient's history.  She denied any tumor or malignancy from  the pathology. The patient is status post seven spontaneous vaginal  deliveries.  Past medical history: Per history of present illness and  chronic hypertension. Medications: Protonix, Reglan, Phenergan, Demerol, and  Percocet.   ALLERGIES:  PENICILLIN and CODEINE.   SOCIAL HISTORY:  She is widowed. She denies any tobacco, alcohol, or  substance abuse.   PHYSICAL EXAMINATION:  GENERAL: A very pleasant, slim, and elderly black  female in no acute distress.  VITAL SIGNS: Temperature 98.2, blood pressure 139/76, pulse 76, respirations  20.  LUNGS: Clear to auscultation bilaterally.  HEART: Regular rate and rhythm.  ABDOMEN: Soft,  nontender, nondistended.  PELVIC EXAM: Atrophic external genitalia. Vaginal mucosa atrophic and clean.  Bimanual examination with a questionable fullness in the right adnexa;  however, the adnexa is nontender. The left adnexa is nonpalpable.  Rectovaginal exam confirms.   Impression was right-sided pelvic mass, possible ovarian neoplasm.  Malignancy unlikely. Plan was exploratory laparotomy in the conjunction with  operative laparoscopy by Dr. Adolph Pollack and bilateral salpingo-  oophorectomy.   Admitting laboratory values revealed a hemoglobin of 11.1, hematocrit 33,  white blood cell count 7100, platelet count 298,000.  Comprehensive  metabolic panel was within normal limits.   HOSPITAL COURSE:  The patient underwent an operative laparoscopy  cholecystectomy with cholangiogram and right salpingo-oophorectomy on Oct 31, 2003. There were no intraoperative complications. Postoperative course  was  uncomplicated and the patient was discharged home on postoperative day  #4 in good condition.   Discharge laboratory values revealed a hemoglobin of 8.6, hematocrit 26,  white blood cell count 10,000, platelet count 243,000.   DISCHARGE DISPOSITION:  Continue medications prior to coming to the  hospital. Tylox one to two tablets q.4h. p.r.n. The patient is to follow up  at the [facility] with Dr. Clearance Coots on Friday, Nov 07, 2003, for  removal of staples. She has instructions for follow up with Dr. Abbey Chatters.  Routine written postoperative instructions were given to the patient on  discharge.                                               Charles A. Clearance Coots, M.D.    CAH/MEDQ  D:  11/04/2003  T:  11/05/2003  Job:  [id number]   cc:   Adolph Pollack, M.D.  [address]  [place]  [place] [address]  Fax: [phone]   Jarome Matin, M.D.  [address] [place]  [place] [address]  Fax: [phone]

## 2010-12-21 ENCOUNTER — Emergency Department (HOSPITAL_COMMUNITY)
Admission: EM | Admit: 2010-12-21 | Discharge: 2010-12-21 | Disposition: A | Discharge status: Home / Self Care | Payer: Medicare Other | Attending: Emergency Medicine | Admitting: Emergency Medicine

## 2010-12-21 DIAGNOSIS — E119 Type 2 diabetes mellitus without complications: Secondary | ICD-10-CM | POA: Insufficient documentation

## 2010-12-21 DIAGNOSIS — I10 Essential (primary) hypertension: Secondary | ICD-10-CM | POA: Insufficient documentation

## 2010-12-21 DIAGNOSIS — R51 Headache: Secondary | ICD-10-CM | POA: Insufficient documentation

## 2010-12-21 DIAGNOSIS — F0390 Unspecified dementia without behavioral disturbance: Secondary | ICD-10-CM | POA: Insufficient documentation

## 2011-02-16 IMAGING — CR DG KNEE COMPLETE 4+V*R*
4 series · 4 of 4 positions shown · non-contrast
Comparison: 02/02/2009.

CLINICAL DATA: Status post fall.  Anterior right knee pain and
swelling.

RIGHT KNEE - COMPLETE 4+ VIEW

[t knee ap right]
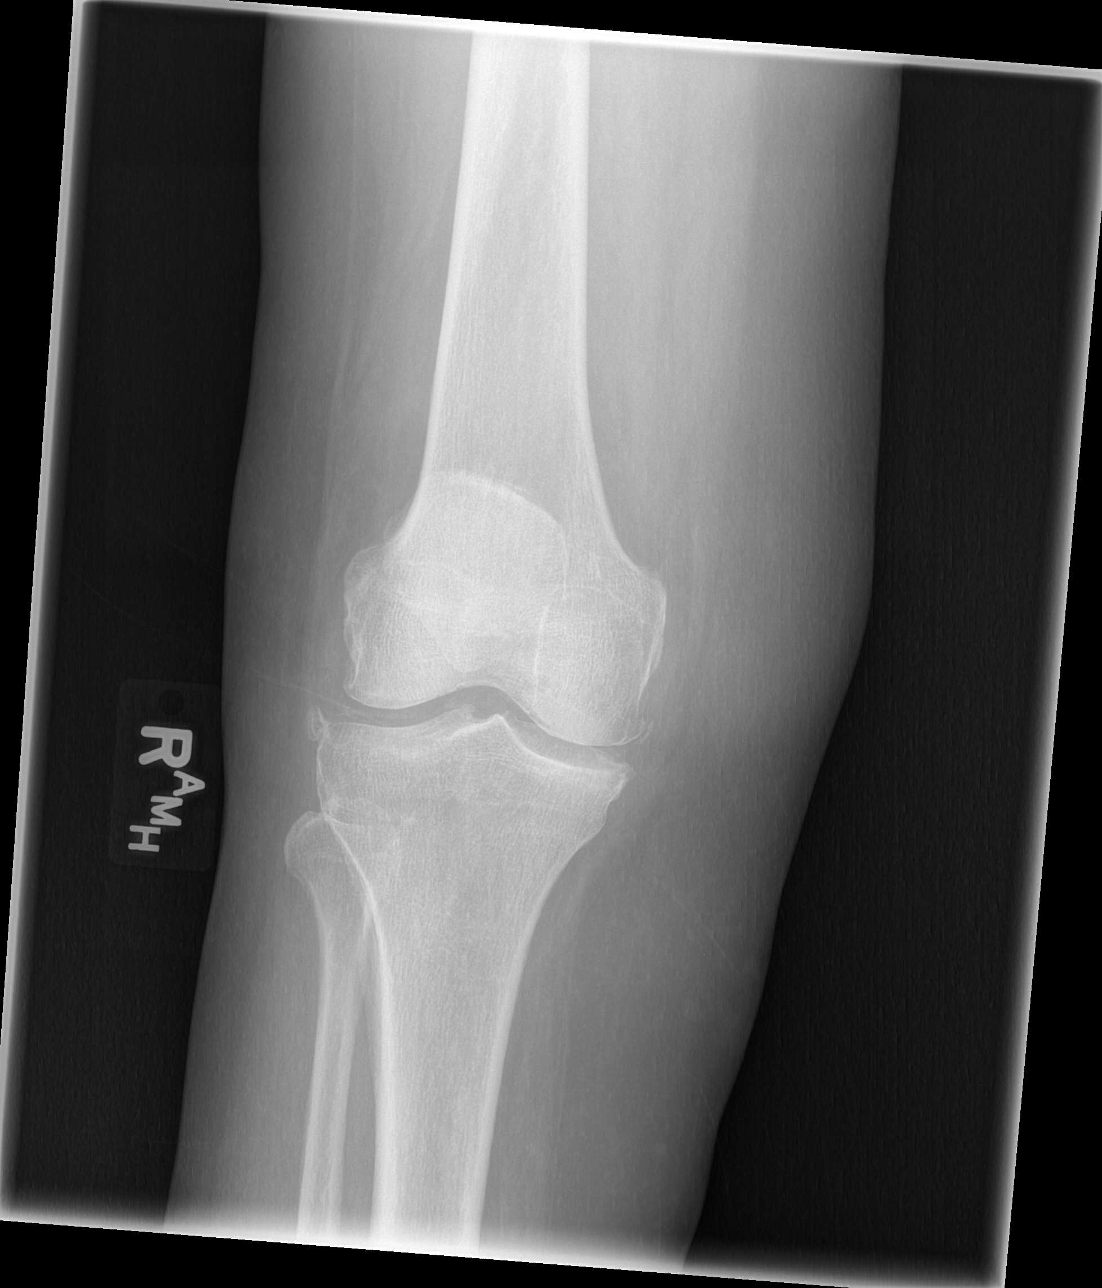

[t knee oblique right (1 of 2)]
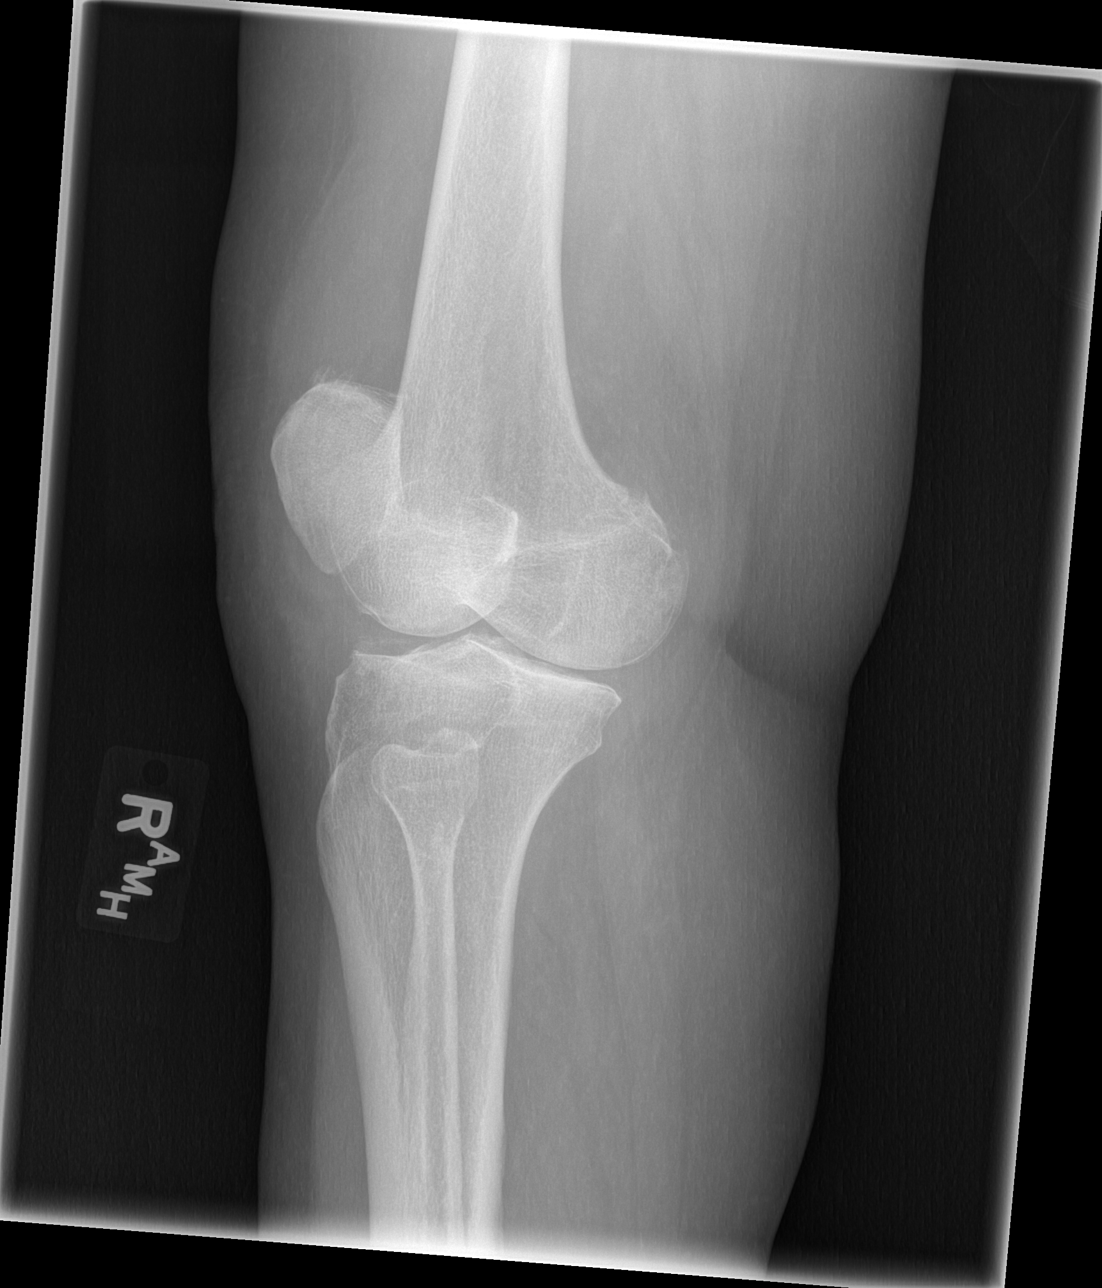

[t knee oblique right (2 of 2)]
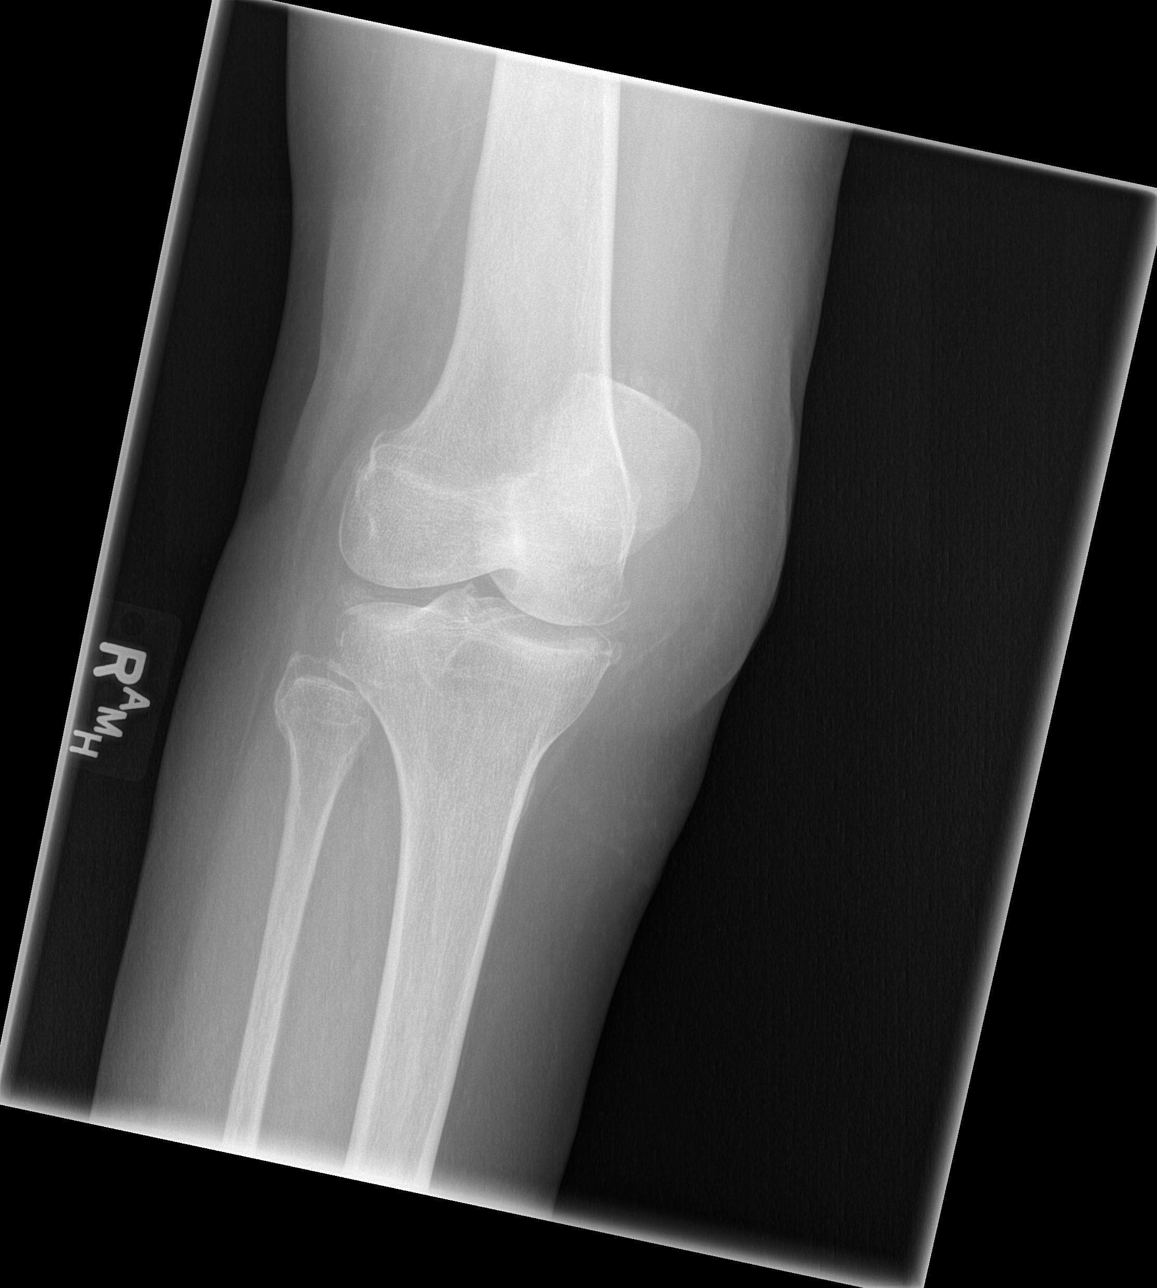

[t knee lat right]
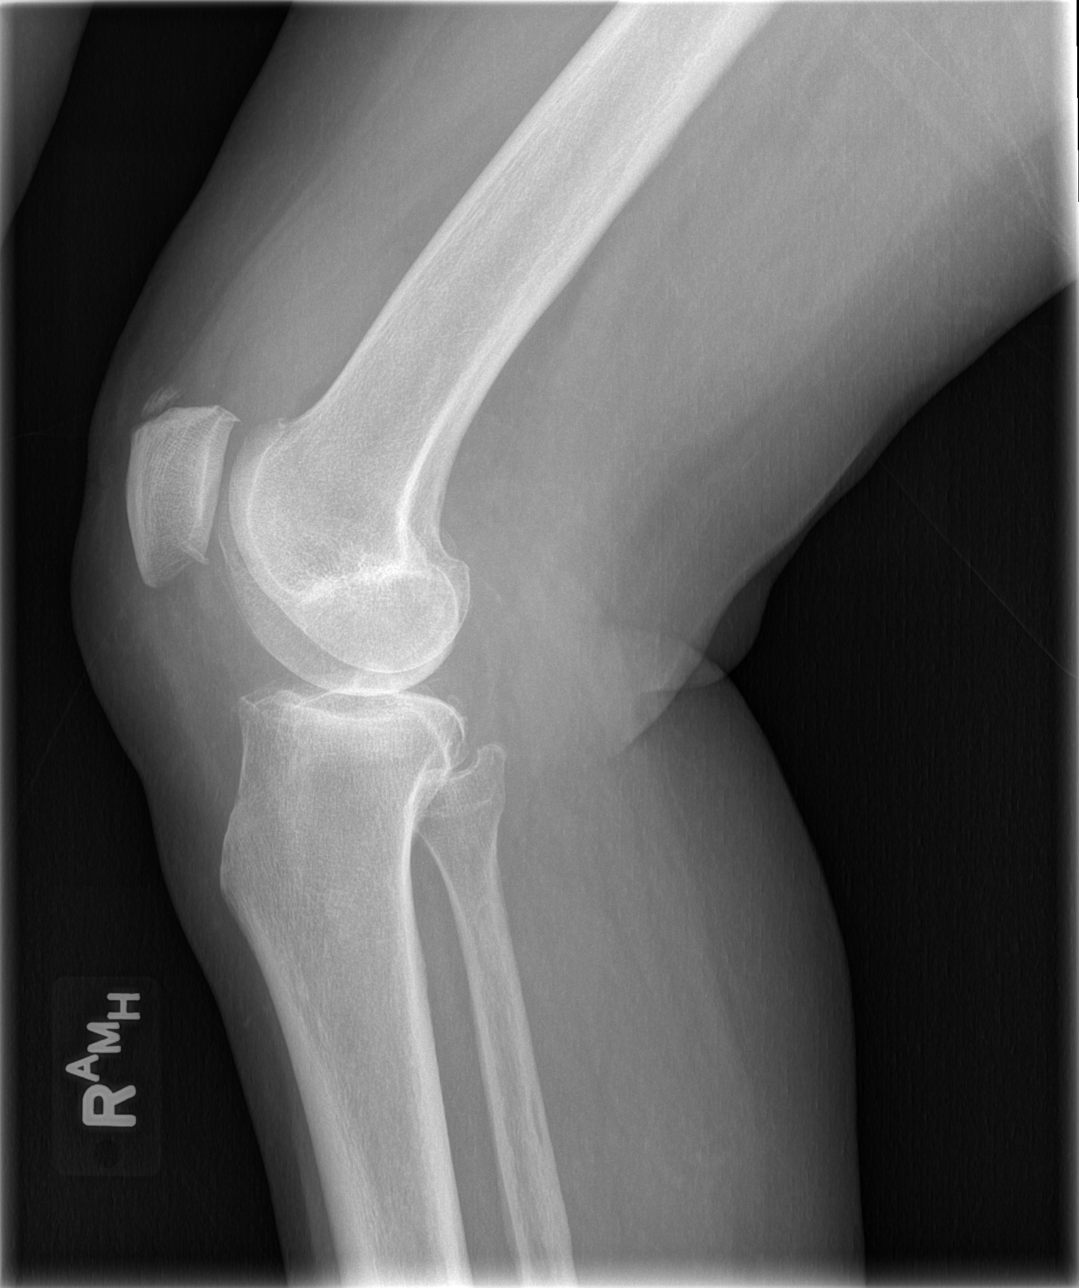

[4 of 4 positions shown; findings below may reference images not displayed]

FINDINGS: A moderate sized joint effusion is present.  No acute
fracture dislocation is evident.  Prepatellar soft tissue swelling
is noted as well.  Chondrocalcinosis and degenerative changes
compatible with CPPD.
IMPRESSION: 1.  Moderate sized joint effusion without evidence for underlying
acute osseous abnormality.
2.  Changes compatible with CPPD.

## 2011-03-02 ENCOUNTER — Other Ambulatory Visit (HOSPITAL_COMMUNITY): Payer: Self-pay | Admitting: Internal Medicine

## 2011-03-02 DIAGNOSIS — Z1231 Encounter for screening mammogram for malignant neoplasm of breast: Secondary | ICD-10-CM

## 2011-03-10 LAB — URINALYSIS, ROUTINE W REFLEX MICROSCOPIC
Bilirubin Urine: NEGATIVE
Glucose, UA: NEGATIVE
Ketones, ur: NEGATIVE
Nitrite: NEGATIVE
Protein, ur: NEGATIVE
Specific Gravity, Urine: 1.015
Urobilinogen, UA: 0.2
pH: 6.5

## 2011-03-10 LAB — URINE MICROSCOPIC-ADD ON

## 2011-03-10 LAB — URINE CULTURE

## 2011-03-16 LAB — DIFFERENTIAL
Basophils Relative: 0
Eosinophils Absolute: 0.1
Monocytes Relative: 5
Neutrophils Relative %: 75

## 2011-03-16 LAB — URINE MICROSCOPIC-ADD ON

## 2011-03-16 LAB — COMPREHENSIVE METABOLIC PANEL
ALT: 11
Alkaline Phosphatase: 80
CO2: 33 — ABNORMAL HIGH
Chloride: 103
Glucose, Bld: 140 — ABNORMAL HIGH
Potassium: 3.3 — ABNORMAL LOW
Sodium: 142
Total Bilirubin: 0.6
Total Protein: 6.9

## 2011-03-16 LAB — URINALYSIS, ROUTINE W REFLEX MICROSCOPIC
Bilirubin Urine: NEGATIVE
Hgb urine dipstick: NEGATIVE
Protein, ur: NEGATIVE
Urobilinogen, UA: 0.2

## 2011-03-16 LAB — CBC
Hemoglobin: 12.4
RBC: 4.09
RDW: 13.3
WBC: 7.9

## 2011-03-16 LAB — URINE CULTURE: Colony Count: >=100000

## 2011-03-16 LAB — B-NATRIURETIC PEPTIDE (CONVERTED LAB): Pro B Natriuretic peptide (BNP): 95.4

## 2011-03-18 LAB — GLUCOSE, CAPILLARY: Glucose-Capillary: 117 — ABNORMAL HIGH

## 2011-03-18 LAB — URINALYSIS, ROUTINE W REFLEX MICROSCOPIC
Glucose, UA: NEGATIVE
Hgb urine dipstick: NEGATIVE
Ketones, ur: NEGATIVE
Protein, ur: NEGATIVE
Urobilinogen, UA: 0.2

## 2011-03-18 LAB — URINE CULTURE: Colony Count: >=100000

## 2011-03-18 LAB — URINE MICROSCOPIC-ADD ON

## 2011-03-29 ENCOUNTER — Ambulatory Visit (HOSPITAL_COMMUNITY)
Admission: RE | Admit: 2011-03-29 | Discharge: 2011-03-29 | Disposition: A | Discharge status: Home / Self Care | Payer: Medicare Other | Source: Ambulatory Visit | Attending: Internal Medicine | Admitting: Internal Medicine

## 2011-03-29 DIAGNOSIS — Z1231 Encounter for screening mammogram for malignant neoplasm of breast: Secondary | ICD-10-CM | POA: Insufficient documentation

## 2011-04-04 LAB — URINALYSIS, ROUTINE W REFLEX MICROSCOPIC
Glucose, UA: NEGATIVE
Hgb urine dipstick: NEGATIVE
Ketones, ur: NEGATIVE
Protein, ur: NEGATIVE

## 2011-05-02 ENCOUNTER — Emergency Department (HOSPITAL_COMMUNITY)
Admission: EM | Admit: 2011-05-02 | Discharge: 2011-05-02 | Discharge status: Against Medical Advice | Payer: Medicare Other | Attending: Emergency Medicine | Admitting: Emergency Medicine

## 2011-05-02 DIAGNOSIS — M542 Cervicalgia: Secondary | ICD-10-CM | POA: Insufficient documentation

## 2011-05-27 ENCOUNTER — Emergency Department (HOSPITAL_COMMUNITY)
Admission: EM | Admit: 2011-05-27 | Discharge: 2011-05-27 | Disposition: A | Discharge status: Home / Self Care | Payer: Medicare Other | Attending: Emergency Medicine | Admitting: Emergency Medicine

## 2011-05-27 ENCOUNTER — Encounter (HOSPITAL_COMMUNITY): Payer: Self-pay | Admitting: Emergency Medicine

## 2011-05-27 DIAGNOSIS — F0390 Unspecified dementia without behavioral disturbance: Secondary | ICD-10-CM | POA: Insufficient documentation

## 2011-05-27 DIAGNOSIS — E119 Type 2 diabetes mellitus without complications: Secondary | ICD-10-CM | POA: Insufficient documentation

## 2011-05-27 DIAGNOSIS — R531 Weakness: Secondary | ICD-10-CM

## 2011-05-27 DIAGNOSIS — Z79899 Other long term (current) drug therapy: Secondary | ICD-10-CM | POA: Insufficient documentation

## 2011-05-27 DIAGNOSIS — R5381 Other malaise: Secondary | ICD-10-CM | POA: Insufficient documentation

## 2011-05-27 DIAGNOSIS — R5383 Other fatigue: Secondary | ICD-10-CM | POA: Insufficient documentation

## 2011-05-27 DIAGNOSIS — I10 Essential (primary) hypertension: Secondary | ICD-10-CM | POA: Insufficient documentation

## 2011-05-27 DIAGNOSIS — R42 Dizziness and giddiness: Secondary | ICD-10-CM | POA: Insufficient documentation

## 2011-05-27 LAB — POCT I-STAT, CHEM 8
Creatinine, Ser: 1 mg/dL (ref 0.50–1.10)
Hemoglobin: 13.3 g/dL (ref 12.0–15.0)
Sodium: 141 mEq/L (ref 135–145)
TCO2: 29 mmol/L (ref 0–100)

## 2011-05-27 LAB — CBC
Platelets: 253 10*3/uL (ref 150–400)
RDW: 12.4 % (ref 11.5–15.5)
WBC: 7.7 10*3/uL (ref 4.0–10.5)

## 2011-05-27 LAB — URINALYSIS, ROUTINE W REFLEX MICROSCOPIC
Bilirubin Urine: NEGATIVE
Nitrite: NEGATIVE
Specific Gravity, Urine: 1.013 (ref 1.005–1.030)
Urobilinogen, UA: 0.2 mg/dL (ref 0.0–1.0)

## 2011-05-27 LAB — GLUCOSE, CAPILLARY: Glucose-Capillary: 126 mg/dL — ABNORMAL HIGH (ref 70–99)

## 2011-05-27 LAB — TROPONIN I: Troponin I: <0.3 ng/mL (ref <0.30)

## 2011-05-27 LAB — URINE MICROSCOPIC-ADD ON

## 2011-05-27 MED ORDER — SODIUM CHLORIDE 0.9 % IV BOLUS (SEPSIS)
1000.0000 mL | Freq: Once | INTRAVENOUS | Status: AC
  Administered 2011-05-27: 1000 mL via INTRAVENOUS

## 2011-05-27 NOTE — ED Notes (Signed)
Pt presenting to ed with c/o dizziness onset this am. Pt states she almost fell this am. Pt denies chest pain at this time.

## 2011-05-27 NOTE — ED Provider Notes (Signed)
History     CSN: [id number] Arrival date & time: 05/27/2011  9:07 AM   None     Chief Complaint  Patient presents with  . Dizziness    (Consider location/radiation/quality/duration/timing/severity/associated sxs/prior treatment) HPI Level V caveat history of dementia history is given from patient and patient's daughter who accompanies her. Complains of dizziness i.e. lightheadedness worse with standing improved with remaining still upon awakening 8:30 AM today symptoms resolved upon arrival here no treatment prior to coming here. Daughter to blood pressure yesterday which and noticed to be 116. Patient has been eating well no other complaint no pain anywhere. No treatment prior to coming here Past Medical History  Diagnosis Date  . DIABETES MELLITUS, TYPE II 05/18/2010  . DEMENTIA 05/18/2010  . HYPERTENSION 05/18/2010  . ASTHMA 05/18/2010  . COPD 05/18/2010  . GERD 05/18/2010  . OSTEOARTHRITIS 05/18/2010  . DIZZINESS 06/29/2010    Past Surgical History  Procedure Date  . Cholecystectomy   . Abdominal hysterectomy   . Foot surgery     x's 2    Family History  Problem Relation Age of Onset  . Arthritis Mother   . Arthritis Father     History  Substance Use Topics  . Smoking status: Former Smoker    Types: Cigarettes    Quit date: 06/20/1968  . Smokeless tobacco: Not on file  . Alcohol Use: No    OB History    Grav Para Term Preterm Abortions TAB SAB Ect Mult Living                  Review of Systems  Unable to perform ROS: Dementia  Constitutional:       Lightheadedness    Allergies  Morphine; Penicillins; and Codeine  Home Medications   Current Outpatient Rx  Name Route Sig Dispense Refill  . ALBUTEROL SULFATE 0.63 MG/3ML IN NEBU Nebulization Take 1 ampule by nebulization every 4 (four) hours as needed.      . ASPIRIN 81 MG PO TABS Oral Take 81 mg by mouth daily.      . DONEPEZIL HCL 10 MG PO TABS Oral Take 10 mg by mouth at bedtime.      Marland Kitchen  ESOMEPRAZOLE MAGNESIUM 40 MG PO CPDR Oral Take 40 mg by mouth daily before breakfast.      . MECLIZINE HCL 12.5 MG PO TABS Oral Take 12.5 mg by mouth every 6 (six) hours as needed.      Marland Kitchen METFORMIN HCL ER 500 MG PO TB24 Oral Take 500 mg by mouth daily with breakfast.      . MONTELUKAST SODIUM 10 MG PO TABS Oral Take 10 mg by mouth at bedtime.      Marland Kitchen OLMESARTAN MEDOXOMIL-HCTZ 40-25 MG PO TABS Oral Take 1 tablet by mouth daily.      Marland Kitchen ALIGN 4 MG PO CAPS Oral Take by mouth daily.      Marland Kitchen VITAMIN B-12 1000 MCG PO TABS Oral Take 1,000 mcg by mouth daily.        BP 122/64  Pulse 75  Temp(Src) 98 F (36.7 C) (Oral)  Resp 20  SpO2 96%  Physical Exam  Nursing note and vitals reviewed. Constitutional: She appears well-developed and well-nourished.  HENT:  Head: Normocephalic and atraumatic.  Eyes: Conjunctivae are normal. Pupils are equal, round, and reactive to light.  Neck: Neck supple. No tracheal deviation present. No thyromegaly present.  Cardiovascular: Normal rate and regular rhythm.   No murmur heard. Pulmonary/Chest: Effort  normal and breath sounds normal.  Abdominal: Soft. Bowel sounds are normal. She exhibits no distension. There is no tenderness.  Genitourinary: Guaiac negative stool.  Musculoskeletal: Normal range of motion. She exhibits no edema and no tenderness.  Neurological: She is alert. Coordination normal.       Walks with cane, without assistance slight lightheadedness upon standing  Skin: Skin is warm and dry. No rash noted.  Psychiatric: She has a normal mood and affect.    ED Course  Procedures (including critical care time)  Labs Reviewed  GLUCOSE, CAPILLARY - Abnormal; Notable for the following:    Glucose-Capillary 126 (*)    All other components within normal limits  POCT CBG MONITORING   No results found. Results for orders placed during the hospital encounter of 05/27/11  GLUCOSE, CAPILLARY      Component Value Range   Glucose-Capillary 126 (*) 70 -  99 (mg/dL)   Comment 1 Notify RN     Comment 2 Documented in Chart    TROPONIN I      Component Value Range   Troponin I <0.30  <0.30 (ng/mL)  CBC      Component Value Range   WBC 7.7  4.0 - 10.5 (K/uL)   RBC 4.05  3.87 - 5.11 (MIL/uL)   Hemoglobin 12.3  12.0 - 15.0 (g/dL)   HCT 16.1  09.6 - 04.5 (%)   MCV 90.9  78.0 - 100.0 (fL)   MCH 30.4  26.0 - 34.0 (pg)   MCHC 33.4  30.0 - 36.0 (g/dL)   RDW 40.9  81.1 - 91.4 (%)   Platelets 253  150 - 400 (K/uL)  URINALYSIS, ROUTINE W REFLEX MICROSCOPIC      Component Value Range   Color, Urine YELLOW  YELLOW    APPearance CLEAR  CLEAR    Specific Gravity, Urine 1.013  1.005 - 1.030    pH 6.0  5.0 - 8.0    Glucose, UA NEGATIVE  NEGATIVE (mg/dL)   Hgb urine dipstick NEGATIVE  NEGATIVE    Bilirubin Urine NEGATIVE  NEGATIVE    Ketones, ur NEGATIVE  NEGATIVE (mg/dL)   Protein, ur NEGATIVE  NEGATIVE (mg/dL)   Urobilinogen, UA 0.2  0.0 - 1.0 (mg/dL)   Nitrite NEGATIVE  NEGATIVE    Leukocytes, UA TRACE (*) NEGATIVE   OCCULT BLOOD, POC DEVICE      Component Value Range   Fecal Occult Bld NEGATIVE    POCT I-STAT, CHEM 8      Component Value Range   Sodium 141  135 - 145 (mEq/L)   Potassium 3.9  3.5 - 5.1 (mEq/L)   Chloride 104  96 - 112 (mEq/L)   BUN 14  6 - 23 (mg/dL)   Creatinine, Ser 7.82  0.50 - 1.10 (mg/dL)   Glucose, Bld 956 (*) 70 - 99 (mg/dL)   Calcium, Ion 2.13  0.86 - 1.32 (mmol/L)   TCO2 29  0 - 100 (mmol/L)   Hemoglobin 13.3  12.0 - 15.0 (g/dL)   HCT 57.8  46.9 - 62.9 (%)  URINE MICROSCOPIC-ADD ON      Component Value Range   Squamous Epithelial / LPF RARE  RARE    WBC, UA 3-6  <3 (WBC/hpf)   Bacteria, UA FEW (*) RARE    No results found.   No diagnosis found.   Date: 05/27/2011  Rate: 70  Rhythm: normal sinus rhythm  QRS Axis: normal  Intervals: normal  ST/T Wave abnormalities: normal  Conduction  Disutrbances:none  Narrative Interpretation:   Old EKG Reviewed: unchanged No significant change from  06/09/2010  1:20 PM patient ate a meal and feels improved after treatmentwith intravenous bolus normal saline received here MDM   lightheadedness felt possibly due to relatively low blood pressure for this patient. 1025Spoke with Dr. Allyne Gee plan withhold Benicar this weekend. An appointment has been scheduled with Dr. Allyne Gee office for 05/30/2011 at 10:30 AM Diagnosis weakness.       Doug Sou, MD 05/27/11 1330

## 2011-11-07 ENCOUNTER — Other Ambulatory Visit: Payer: Self-pay | Admitting: Family Medicine

## 2011-11-07 ENCOUNTER — Ambulatory Visit
Admission: RE | Admit: 2011-11-07 | Discharge: 2011-11-07 | Disposition: A | Discharge status: Home / Self Care | Payer: No Typology Code available for payment source | Source: Ambulatory Visit | Attending: Family Medicine | Admitting: Family Medicine

## 2011-11-07 DIAGNOSIS — R61 Generalized hyperhidrosis: Secondary | ICD-10-CM

## 2012-01-02 ENCOUNTER — Other Ambulatory Visit: Payer: Self-pay | Admitting: Family Medicine

## 2012-01-02 ENCOUNTER — Ambulatory Visit
Admission: RE | Admit: 2012-01-02 | Discharge: 2012-01-02 | Disposition: A | Discharge status: Home / Self Care | Payer: No Typology Code available for payment source | Source: Ambulatory Visit | Attending: Family Medicine | Admitting: Family Medicine

## 2012-01-02 DIAGNOSIS — R05 Cough: Secondary | ICD-10-CM

## 2012-02-23 ENCOUNTER — Encounter (HOSPITAL_COMMUNITY): Payer: Self-pay | Admitting: *Deleted

## 2012-02-23 ENCOUNTER — Emergency Department (HOSPITAL_COMMUNITY): Payer: Medicare (Managed Care)

## 2012-02-23 ENCOUNTER — Emergency Department (HOSPITAL_COMMUNITY)
Admission: EM | Admit: 2012-02-23 | Discharge: 2012-02-23 | Disposition: A | Discharge status: Home / Self Care | Payer: Medicare (Managed Care) | Attending: Emergency Medicine | Admitting: Emergency Medicine

## 2012-02-23 DIAGNOSIS — Z79899 Other long term (current) drug therapy: Secondary | ICD-10-CM | POA: Insufficient documentation

## 2012-02-23 DIAGNOSIS — R42 Dizziness and giddiness: Secondary | ICD-10-CM | POA: Insufficient documentation

## 2012-02-23 DIAGNOSIS — J449 Chronic obstructive pulmonary disease, unspecified: Secondary | ICD-10-CM | POA: Insufficient documentation

## 2012-02-23 DIAGNOSIS — E119 Type 2 diabetes mellitus without complications: Secondary | ICD-10-CM | POA: Insufficient documentation

## 2012-02-23 DIAGNOSIS — F068 Other specified mental disorders due to known physiological condition: Secondary | ICD-10-CM | POA: Insufficient documentation

## 2012-02-23 DIAGNOSIS — J4489 Other specified chronic obstructive pulmonary disease: Secondary | ICD-10-CM | POA: Insufficient documentation

## 2012-02-23 DIAGNOSIS — H538 Other visual disturbances: Secondary | ICD-10-CM | POA: Insufficient documentation

## 2012-02-23 DIAGNOSIS — I10 Essential (primary) hypertension: Secondary | ICD-10-CM | POA: Insufficient documentation

## 2012-02-23 DIAGNOSIS — Z87891 Personal history of nicotine dependence: Secondary | ICD-10-CM | POA: Insufficient documentation

## 2012-02-23 LAB — URINALYSIS, ROUTINE W REFLEX MICROSCOPIC
Glucose, UA: NEGATIVE mg/dL
Hgb urine dipstick: NEGATIVE
Leukocytes, UA: NEGATIVE
Protein, ur: NEGATIVE mg/dL
pH: 6.5 (ref 5.0–8.0)

## 2012-02-23 LAB — BASIC METABOLIC PANEL
CO2: 29 mEq/L (ref 19–32)
Calcium: 9.6 mg/dL (ref 8.4–10.5)
Chloride: 98 mEq/L (ref 96–112)
Glucose, Bld: 108 mg/dL — ABNORMAL HIGH (ref 70–99)
Sodium: 137 mEq/L (ref 135–145)

## 2012-02-23 LAB — CBC WITH DIFFERENTIAL/PLATELET
Eosinophils Relative: 2 % (ref 0–5)
HCT: 37.5 % (ref 36.0–46.0)
Lymphocytes Relative: 20 % (ref 12–46)
Lymphs Abs: 1.9 10*3/uL (ref 0.7–4.0)
MCV: 89.5 fL (ref 78.0–100.0)
Monocytes Absolute: 0.4 10*3/uL (ref 0.1–1.0)
Neutro Abs: 6.8 10*3/uL (ref 1.7–7.7)
RBC: 4.19 MIL/uL (ref 3.87–5.11)
WBC: 9.4 10*3/uL (ref 4.0–10.5)

## 2012-02-23 NOTE — ED Notes (Signed)
D/C IV left AC. Clear and intact and dry when removed.

## 2012-02-23 NOTE — ED Provider Notes (Signed)
History     CSN: [id number]  Arrival date & time 02/23/12  1430   First MD Initiated Contact with Patient 02/23/12 1605      Chief Complaint  Patient presents with  . Dizziness  . Blurred Vision    (Consider location/radiation/quality/duration/timing/severity/associated sxs/prior treatment) HPI History by patient and daughter at bedside. Daughter reports the patient was in her normal state of health when she went to work this morning. Home health aide called daughter around lunch to report the patient had been complaining of dizziness and blurred vision during the morning. There has been no reported facial droop, slurred speech or arm weakness. Pt reports she is back to normal now. Daughter was primarily concerned about her blood sugar being elevated but relieved to discover it is normal here.   Past Medical History  Diagnosis Date  . DIABETES MELLITUS, TYPE II 05/18/2010  . DEMENTIA 05/18/2010  . HYPERTENSION 05/18/2010  . ASTHMA 05/18/2010  . COPD 05/18/2010  . GERD 05/18/2010  . OSTEOARTHRITIS 05/18/2010  . DIZZINESS 06/29/2010    Past Surgical History  Procedure Date  . Cholecystectomy   . Abdominal hysterectomy   . Foot surgery     x's 2    Family History  Problem Relation Age of Onset  . Arthritis Mother   . Arthritis Father     History  Substance Use Topics  . Smoking status: Former Smoker    Types: Cigarettes    Quit date: 06/20/1968  . Smokeless tobacco: Never Used  . Alcohol Use: No    OB History    Grav Para Term Preterm Abortions TAB SAB Ect Mult Living                  Review of Systems All other systems reviewed and are negative except as noted in HPI.   Allergies  Morphine; Penicillins; and Codeine  Home Medications   Current Outpatient Rx  Name Route Sig Dispense Refill  . ALBUTEROL SULFATE 0.63 MG/3ML IN NEBU Nebulization Take 1 ampule by nebulization every 4 (four) hours as needed. wheezing    . AMLODIPINE BESYLATE 5 MG PO TABS  Oral Take 5 mg by mouth daily.    . ASPIRIN EC 81 MG PO TBEC Oral Take 81 mg by mouth daily.    Marland Kitchen VITAMIN D 1000 UNITS PO TABS Oral Take 1,000 Units by mouth daily.    . DONEPEZIL HCL 10 MG PO TABS Oral Take 10 mg by mouth at bedtime.     . ESTROGENS CONJUGATED 0.3 MG PO TABS Oral Take 0.3 mg by mouth daily.    Marland Kitchen GLIMEPIRIDE 2 MG PO TABS Oral Take 2 mg by mouth daily before breakfast.     . METOCLOPRAMIDE HCL 10 MG PO TABS Oral Take 10 mg by mouth 4 (four) times daily. nausea    . OLMESARTAN MEDOXOMIL-HCTZ 40-25 MG PO TABS Oral Take 1 tablet by mouth daily.     Marland Kitchen TRIMETHOPRIM 100 MG PO TABS Oral Take 100 mg by mouth daily.       BP 154/70  Pulse 65  Temp 97.7 F (36.5 C) (Oral)  Resp 16  SpO2 95%  Physical Exam  Nursing note and vitals reviewed. Constitutional: She appears well-developed and well-nourished.  HENT:  Head: Normocephalic and atraumatic.  Eyes: EOM are normal. Pupils are equal, round, and reactive to light.  Neck: Normal range of motion. Neck supple.  Cardiovascular: Normal rate, normal heart sounds and intact distal pulses.   Pulmonary/Chest:  Effort normal and breath sounds normal.  Abdominal: Bowel sounds are normal. She exhibits no distension. There is no tenderness.  Musculoskeletal: Normal range of motion. She exhibits no edema and no tenderness.  Neurological: She is alert. She has normal strength. No cranial nerve deficit or sensory deficit. She exhibits normal muscle tone. Coordination normal.  Skin: Skin is warm and dry. No rash noted.  Psychiatric: She has a normal mood and affect.    ED Course  Procedures (including critical care time)  Labs Reviewed  GLUCOSE, CAPILLARY - Abnormal; Notable for the following:    Glucose-Capillary 109 (*)     All other components within normal limits  BASIC METABOLIC PANEL - Abnormal; Notable for the following:    Glucose, Bld 108 (*)     GFR calc non Af Amer 81 (*)     All other components within normal limits  CBC  WITH DIFFERENTIAL  URINALYSIS, ROUTINE W REFLEX MICROSCOPIC  URINE CULTURE   Ct Head Wo Contrast  02/23/2012  *RADIOLOGY REPORT*  Clinical Data: Dizziness with blurred vision today.  History of diabetes.  CT HEAD WITHOUT CONTRAST  Technique:  Contiguous axial images were obtained from the base of the skull through the vertex without contrast.  Comparison: Head CT 03/21/2010 and 01/27/2010.  Findings: There is mildly progressive diffuse low density in the periventricular white matter, most consistent with chronic small vessel ischemic change.  There is no evidence of acute intracranial hemorrhage, mass lesion, brain edema or extra-axial fluid collection.  There is no hydrocephalus or evidence of acute infarct.  The visualized paranasal sinuses are clear.  The calvarium is intact.  IMPRESSION:  1.  Mildly progressive diffuse periventricular white matter disease, likely chronic small vessel ischemic change. 2.  No definite acute intracranial findings.   Original Report Authenticated By: Gerrianne Scale, M.D.      No diagnosis found.    MDM   Date: 02/23/2012  Rate: 64  Rhythm: normal sinus rhythm  QRS Axis: normal  Intervals: normal  ST/T Wave abnormalities: nonspecific T wave changes  Conduction Disutrbances:none  Narrative Interpretation:   Old EKG Reviewed: unchanged  Labs and imaging unremarkable. Pt remains asymptomatic.         Charles B. Bernette Mayers, MD 02/23/12 2336

## 2012-02-23 NOTE — ED Notes (Signed)
Pt reports dizziness/ blurry vision that started this morning. Pt is diabetic. Blood sugars have ranged from 100-250s. Pt daughter is worried about pts blurry vision.

## 2012-02-23 NOTE — ED Notes (Signed)
Patient transported to CT 

## 2012-02-24 LAB — URINE CULTURE: Colony Count: 30000

## 2012-04-10 ENCOUNTER — Other Ambulatory Visit (HOSPITAL_COMMUNITY): Payer: Self-pay | Admitting: Family Medicine

## 2012-04-10 DIAGNOSIS — Z1231 Encounter for screening mammogram for malignant neoplasm of breast: Secondary | ICD-10-CM

## 2012-04-26 ENCOUNTER — Ambulatory Visit (HOSPITAL_COMMUNITY)
Admission: RE | Admit: 2012-04-26 | Discharge: 2012-04-26 | Disposition: A | Discharge status: Home / Self Care | Payer: Medicare (Managed Care) | Source: Ambulatory Visit | Attending: Family Medicine | Admitting: Family Medicine

## 2012-04-26 DIAGNOSIS — Z1231 Encounter for screening mammogram for malignant neoplasm of breast: Secondary | ICD-10-CM | POA: Insufficient documentation

## 2015-02-09 ENCOUNTER — Other Ambulatory Visit: Payer: Self-pay | Admitting: Internal Medicine

## 2015-02-09 ENCOUNTER — Ambulatory Visit
Admission: RE | Admit: 2015-02-09 | Discharge: 2015-02-09 | Disposition: A | Discharge status: Home / Self Care | Payer: Medicare (Managed Care) | Source: Ambulatory Visit | Attending: Internal Medicine | Admitting: Internal Medicine

## 2015-02-09 DIAGNOSIS — J4540 Moderate persistent asthma, uncomplicated: Secondary | ICD-10-CM

## 2016-08-06 ENCOUNTER — Encounter (HOSPITAL_COMMUNITY): Payer: Self-pay | Admitting: Oncology

## 2016-08-06 ENCOUNTER — Emergency Department (HOSPITAL_COMMUNITY): Payer: Medicare (Managed Care)

## 2016-08-06 ENCOUNTER — Emergency Department (HOSPITAL_COMMUNITY)
Admission: EM | Admit: 2016-08-06 | Discharge: 2016-08-07 | Disposition: A | Discharge status: Home / Self Care | Payer: Medicare (Managed Care) | Attending: Emergency Medicine | Admitting: Emergency Medicine

## 2016-08-06 DIAGNOSIS — Z7984 Long term (current) use of oral hypoglycemic drugs: Secondary | ICD-10-CM | POA: Insufficient documentation

## 2016-08-06 DIAGNOSIS — Z87891 Personal history of nicotine dependence: Secondary | ICD-10-CM | POA: Diagnosis not present

## 2016-08-06 DIAGNOSIS — J449 Chronic obstructive pulmonary disease, unspecified: Secondary | ICD-10-CM | POA: Insufficient documentation

## 2016-08-06 DIAGNOSIS — R1013 Epigastric pain: Secondary | ICD-10-CM | POA: Diagnosis present

## 2016-08-06 DIAGNOSIS — N3001 Acute cystitis with hematuria: Secondary | ICD-10-CM | POA: Diagnosis not present

## 2016-08-06 DIAGNOSIS — I10 Essential (primary) hypertension: Secondary | ICD-10-CM | POA: Diagnosis not present

## 2016-08-06 DIAGNOSIS — E119 Type 2 diabetes mellitus without complications: Secondary | ICD-10-CM | POA: Insufficient documentation

## 2016-08-06 DIAGNOSIS — Z7982 Long term (current) use of aspirin: Secondary | ICD-10-CM | POA: Insufficient documentation

## 2016-08-06 LAB — URINALYSIS, ROUTINE W REFLEX MICROSCOPIC
BILIRUBIN URINE: NEGATIVE
GLUCOSE, UA: NEGATIVE mg/dL
HGB URINE DIPSTICK: NEGATIVE
Ketones, ur: NEGATIVE mg/dL
NITRITE: NEGATIVE
PROTEIN: 30 mg/dL — AB
SPECIFIC GRAVITY, URINE: 1.025 (ref 1.005–1.030)
pH: 5 (ref 5.0–8.0)

## 2016-08-06 LAB — CBC WITH DIFFERENTIAL/PLATELET
BASOS ABS: 0 10*3/uL (ref 0.0–0.1)
BASOS PCT: 0 %
EOS PCT: 0 %
Eosinophils Absolute: 0.1 10*3/uL (ref 0.0–0.7)
HCT: 37 % (ref 36.0–46.0)
Hemoglobin: 12.6 g/dL (ref 12.0–15.0)
LYMPHS ABS: 1.8 10*3/uL (ref 0.7–4.0)
Lymphocytes Relative: 13 %
MCH: 29.7 pg (ref 26.0–34.0)
MCHC: 34.1 g/dL (ref 30.0–36.0)
MCV: 87.3 fL (ref 78.0–100.0)
Monocytes Absolute: 0.7 10*3/uL (ref 0.1–1.0)
Monocytes Relative: 5 %
NEUTROS PCT: 82 %
Neutro Abs: 11.3 10*3/uL — ABNORMAL HIGH (ref 1.7–7.7)
PLATELETS: 331 10*3/uL (ref 150–400)
RBC: 4.24 MIL/uL (ref 3.87–5.11)
RDW: 13 % (ref 11.5–15.5)
WBC: 13.9 10*3/uL — AB (ref 4.0–10.5)

## 2016-08-06 LAB — COMPREHENSIVE METABOLIC PANEL
ALBUMIN: 3.8 g/dL (ref 3.5–5.0)
ALK PHOS: 60 U/L (ref 38–126)
ALT: 13 U/L — ABNORMAL LOW (ref 14–54)
AST: 18 U/L (ref 15–41)
Anion gap: 9 (ref 5–15)
BUN: 17 mg/dL (ref 6–20)
CHLORIDE: 103 mmol/L (ref 101–111)
CO2: 24 mmol/L (ref 22–32)
Calcium: 8.9 mg/dL (ref 8.9–10.3)
Creatinine, Ser: 0.96 mg/dL (ref 0.44–1.00)
GFR calc Af Amer: 60 mL/min — ABNORMAL LOW (ref >60)
GFR calc non Af Amer: 52 mL/min — ABNORMAL LOW (ref >60)
GLUCOSE: 238 mg/dL — AB (ref 65–99)
Potassium: 3.7 mmol/L (ref 3.5–5.1)
SODIUM: 136 mmol/L (ref 135–145)
Total Bilirubin: 0.6 mg/dL (ref 0.3–1.2)
Total Protein: 7.4 g/dL (ref 6.5–8.1)

## 2016-08-06 LAB — LIPASE, BLOOD: Lipase: 28 U/L (ref 11–51)

## 2016-08-06 LAB — TROPONIN I

## 2016-08-06 LAB — I-STAT CG4 LACTIC ACID, ED
LACTIC ACID, VENOUS: 3.01 mmol/L — AB (ref 0.5–1.9)
Lactic Acid, Venous: 1.22 mmol/L (ref 0.5–1.9)

## 2016-08-06 MED ORDER — PHENAZOPYRIDINE HCL 200 MG PO TABS
200.0000 mg | ORAL_TABLET | Freq: Once | ORAL | Status: AC
  Administered 2016-08-06: 200 mg via ORAL
  Filled 2016-08-06: qty 1

## 2016-08-06 MED ORDER — DEXTROSE 5 % IV SOLN
1.0000 g | Freq: Once | INTRAVENOUS | Status: AC
  Administered 2016-08-06: 1 g via INTRAVENOUS
  Filled 2016-08-06: qty 10

## 2016-08-06 MED ORDER — SODIUM CHLORIDE 0.9 % IJ SOLN
INTRAMUSCULAR | Status: AC
  Filled 2016-08-06: qty 50

## 2016-08-06 MED ORDER — ONDANSETRON HCL 4 MG/2ML IJ SOLN
4.0000 mg | Freq: Once | INTRAMUSCULAR | Status: AC
  Administered 2016-08-06: 4 mg via INTRAVENOUS
  Filled 2016-08-06: qty 2

## 2016-08-06 MED ORDER — CEPHALEXIN 500 MG PO CAPS: 500.0000 mg | ORAL_CAPSULE | Freq: Two times a day (BID) | ORAL | 0 refills | Status: DC

## 2016-08-06 MED ORDER — IOPAMIDOL (ISOVUE-300) INJECTION 61%
INTRAVENOUS | Status: AC
  Administered 2016-08-06: 80 mL via INTRAVENOUS
  Filled 2016-08-06: qty 100

## 2016-08-06 MED ORDER — SODIUM CHLORIDE 0.9 % IV BOLUS (SEPSIS)
500.0000 mL | Freq: Once | INTRAVENOUS | Status: AC
  Administered 2016-08-06: 500 mL via INTRAVENOUS

## 2016-08-06 NOTE — ED Notes (Signed)
No respiratory or acute distress noted alert and oriented visitor at bedside call light in reach. 

## 2016-08-06 NOTE — ED Triage Notes (Signed)
Pt c/o generalized abdominal pain since she ate lunch this afternoon.  Pt is vague about sx.  Per pt's daughter, pt ate her typical lunch, no new foods.

## 2016-08-06 NOTE — Discharge Instructions (Addendum)
°  You have a possible abnormal tissue in your pancreas. He continued to have pain, you will need to have an MRI to exclude cancer. Follow-up with your doctor for this.  Please follow with your primary care doctor in the next 2 days for a check-up. They must obtain records for further management.   Do not hesitate to return to the Emergency Department for any new, worsening or concerning symptoms.

## 2016-08-06 NOTE — ED Provider Notes (Signed)
[facility]-EMERGENCY DEPT Provider Note   CSN: [id number] Arrival date & time: 08/06/16  1934     History   Chief Complaint Chief Complaint  Patient presents with  . Abdominal Pain   HPI   Blood pressure 109/69, pulse 75, temperature 98.1 F (36.7 C), temperature source Oral, resp. rate 18, SpO2 99 %.  [name] is a 83 y.o. female  with past medical history significant for dementia and diabetes accompanied by daughter who is caretaker and medical power of attorney complaining of epigastric abdominal pain onset several hours ago, she normally is very pleasant doesn't complain very much and she persistently voiced discomfort about the epigastrium this afternoon.  Daughter states she's been eating and drinking normally with no fever, chills, nausea, vomiting she does have intermittent diarrhea but nothing out of the norm recently. Level V caveat secondary to dementia.  Past Medical History:  Diagnosis Date  . ASTHMA 05/18/2010  . COPD 05/18/2010  . DEMENTIA 05/18/2010  . DIABETES MELLITUS, TYPE II 05/18/2010  . DIZZINESS 06/29/2010  . GERD 05/18/2010  . HYPERTENSION 05/18/2010  . OSTEOARTHRITIS 05/18/2010    Patient Active Problem List   Diagnosis Date Noted  . DIZZINESS 06/29/2010  . DIABETES MELLITUS, TYPE II 05/18/2010  . DEMENTIA 05/18/2010  . HYPERTENSION 05/18/2010  . ASTHMA 05/18/2010  . COPD 05/18/2010  . GERD 05/18/2010  . OSTEOARTHRITIS 05/18/2010  . WEIGHT LOSS, ABNORMAL 05/18/2010    Past Surgical History:  Procedure Laterality Date  . ABDOMINAL HYSTERECTOMY    . CHOLECYSTECTOMY    . FOOT SURGERY     x's 2    OB History    No data available       Home Medications    Prior to Admission medications   Medication Sig Start Date End Date Taking? Authorizing Provider  albuterol (ACCUNEB) 0.63 MG/3ML nebulizer solution Take 1 ampule by nebulization every 4 (four) hours as needed for wheezing or shortness of breath.    Yes Historical Provider, MD    amLODipine (NORVASC) 5 MG tablet Take 5 mg by mouth daily.   Yes Historical Provider, MD  aspirin 81 MG chewable tablet Chew 81 mg by mouth daily.   Yes Historical Provider, MD  cholecalciferol (VITAMIN D) 1000 UNITS tablet Take 1,000 Units by mouth daily.   Yes Historical Provider, MD  donepezil (ARICEPT) 10 MG tablet Take 10 mg by mouth at bedtime.    Yes Historical Provider, MD  estrogens, conjugated, (PREMARIN) 0.3 MG tablet Take 0.3 mg by mouth daily.   Yes Historical Provider, MD  glimepiride (AMARYL) 2 MG tablet Take 2 mg by mouth daily before breakfast.    Yes Historical Provider, MD  metoCLOPramide (REGLAN) 10 MG tablet Take 10 mg by mouth every 6 (six) hours as needed for nausea or vomiting.    Yes Historical Provider, MD  olmesartan-hydrochlorothiazide (BENICAR HCT) 40-25 MG per tablet Take 1 tablet by mouth daily.    Yes Historical Provider, MD  trimethoprim (TRIMPEX) 100 MG tablet Take 100 mg by mouth daily.    Yes Historical Provider, MD  cephALEXin (KEFLEX) 500 MG capsule Take 1 capsule (500 mg total) by mouth 2 (two) times daily. 08/06/16   Joni ReiningNicole Jeany Seville, PA-C    Family History Family History  Problem Relation Age of Onset  . Arthritis Father   . Arthritis Mother     Social History Social History  Substance Use Topics  . Smoking status: Former Smoker    Types: Cigarettes  Quit date: 06/20/1968  . Smokeless tobacco: Never Used  . Alcohol use No     Allergies   Morphine; Penicillins; and Codeine   Review of Systems Review of Systems  10 systems reviewed and found to be negative, except as noted in the HPI.  Physical Exam Updated Vital Signs BP 109/69   Pulse 75   Temp 98.1 F (36.7 C) (Oral)   Resp 18   SpO2 99%   Physical Exam  Constitutional: She appears well-developed and well-nourished. No distress.  HENT:  Head: Normocephalic and atraumatic.  Mouth/Throat: Oropharynx is clear and moist.  Eyes: Conjunctivae and EOM are normal. Pupils are  equal, round, and reactive to light.  Neck: Normal range of motion.  Cardiovascular: Normal rate, regular rhythm and intact distal pulses.   Pulmonary/Chest: Effort normal and breath sounds normal.  Abdominal: Soft. She exhibits no distension and no mass. There is no tenderness. There is no rebound and no guarding. No hernia.  Pt giggles at deep palpation of all quadrants  Genitourinary:  Genitourinary Comments: No CVA tenderness to percussion bilaterally.  Musculoskeletal: Normal range of motion.  Neurological: She is alert.  Pleasantly demented  Skin: She is not diaphoretic.  Psychiatric: She has a normal mood and affect.  Nursing note and vitals reviewed.    ED Treatments / Results  Labs (all labs ordered are listed, but only abnormal results are displayed) Labs Reviewed  CBC WITH DIFFERENTIAL/PLATELET - Abnormal; Notable for the following:       Result Value   WBC 13.9 (*)    Neutro Abs 11.3 (*)    All other components within normal limits  COMPREHENSIVE METABOLIC PANEL - Abnormal; Notable for the following:    Glucose, Bld 238 (*)    ALT 13 (*)    GFR calc non Af Amer 52 (*)    GFR calc Af Amer 60 (*)    All other components within normal limits  URINALYSIS, ROUTINE W REFLEX MICROSCOPIC - Abnormal; Notable for the following:    APPearance CLOUDY (*)    Protein, ur 30 (*)    Leukocytes, UA LARGE (*)    Bacteria, UA FEW (*)    Squamous Epithelial / LPF 6-30 (*)    All other components within normal limits  I-STAT CG4 LACTIC ACID, ED - Abnormal; Notable for the following:    Lactic Acid, Venous 3.01 (*)    All other components within normal limits  URINE CULTURE  LIPASE, BLOOD  TROPONIN I    EKG  EKG Interpretation  Date/Time:  Saturday August 06 2016 20:49:12 EST Ventricular Rate:  73 PR Interval:    QRS Duration: 118 QT Interval:  419 QTC Calculation: 405 R Axis:   -54 Text Interpretation:  Sinus rhythm Multiform ventricular premature complexes  Nonspecific IVCD with LAD Anterior infarct, old Confirmed by ALLEN  MD, ANTHONY ([id number]) on 08/06/2016 9:12:27 PM       Radiology No results found.  Procedures Procedures (including critical care time)  Medications Ordered in ED Medications  sodium chloride 0.9 % bolus 500 mL (0 mLs Intravenous Stopped 08/06/16 2110)  cefTRIAXone (ROCEPHIN) 1 g in dextrose 5 % 50 mL IVPB (0 g Intravenous Stopped 08/06/16 2140)  sodium chloride 0.9 % bolus 500 mL (500 mLs Intravenous New Bag/Given 08/06/16 2144)  phenazopyridine (PYRIDIUM) tablet 200 mg (200 mg Oral Given 08/06/16 2143)     Initial Impression / Assessment and Plan / ED Course  I have reviewed the triage vital signs  and the nursing notes.  Pertinent labs & imaging results that were available during my care of the patient were reviewed by me and considered in my medical decision making (see chart for details).     Vitals:   08/06/16 1937 08/06/16 2019  BP: 108/58 109/69  Pulse: 84 75  Resp: 18   Temp: 98.1 F (36.7 C)   TempSrc: Oral   SpO2: 99% 99%    Medications  sodium chloride 0.9 % bolus 500 mL (0 mLs Intravenous Stopped 08/06/16 2110)  cefTRIAXone (ROCEPHIN) 1 g in dextrose 5 % 50 mL IVPB (0 g Intravenous Stopped 08/06/16 2140)  sodium chloride 0.9 % bolus 500 mL (500 mLs Intravenous New Bag/Given 08/06/16 2144)  phenazopyridine (PYRIDIUM) tablet 200 mg (200 mg Oral Given 08/06/16 2143)    AYLANI SPURLOCK is 83 y.o. female presenting with Abdominal pain onset this afternoon, afebrile and eating and drinking normally. Abdominal exam is benign: Patient giggles at deep palpation of all quadrants. She is very well appearing with normal vital signs. Lactic acid is mildly elevated at 3.0. Blood work with a mild leukocytosis, urinalysis is consistent with infection. Physical exam is not consistent with a pyelonephritis. Patient given gram of Rocephin, will obtain CT abdomen pelvis with contrast, gently hydrated and Dr. Freida Busman will  follow-up lactic acid and CT.     Final Clinical Impressions(s) / ED Diagnoses   Final diagnoses:  Acute cystitis with hematuria    New Prescriptions New Prescriptions   CEPHALEXIN (KEFLEX) 500 MG CAPSULE    Take 1 capsule (500 mg total) by mouth 2 (two) times daily.     Wynetta Emery, PA-C 08/06/16 2152    Lorre Nick, MD 08/06/16 1610    Lorre Nick, MD 08/06/16 2330

## 2016-08-09 LAB — URINE CULTURE

## 2016-08-10 ENCOUNTER — Telehealth: Payer: Self-pay | Admitting: Emergency Medicine

## 2016-08-10 NOTE — Telephone Encounter (Signed)
Post ED Visit - Positive Culture Follow-up  Culture report reviewed by antimicrobial stewardship pharmacist:  []  Enzo BiNathan Batchelder, Pharm.D. []  Celedonio MiyamotoJeremy Frens, Pharm.D., BCPS []  Garvin FilaMike Maccia, Pharm.D. [x]  Georgina PillionElizabeth Martin, Pharm.D., BCPS []  BethesdaMinh Pham, [address]Pharm[address]D., BCPS, AAHIVP []  Estella HuskMichelle Turner, Pharm.D., BCPS, AAHIVP []  Tennis Mustassie Stewart, Pharm.D. []  Sherle Poeob Vincent, [address]Pharm[address]  Positive urine culture Treated with cephalexin, organism sensitive to the same and no further patient follow-up is required at this time.  Berle MullMiller, Captain Blucher 08/10/2016, 10:25 AM

## 2018-02-21 ENCOUNTER — Encounter (HOSPITAL_COMMUNITY): Payer: Self-pay | Admitting: Emergency Medicine

## 2018-02-21 ENCOUNTER — Emergency Department (HOSPITAL_COMMUNITY)
Admission: EM | Admit: 2018-02-21 | Discharge: 2018-02-21 | Disposition: A | Discharge status: Home / Self Care | Payer: Medicare (Managed Care) | Attending: Emergency Medicine | Admitting: Emergency Medicine

## 2018-02-21 ENCOUNTER — Emergency Department (HOSPITAL_COMMUNITY): Payer: Medicare (Managed Care)

## 2018-02-21 ENCOUNTER — Other Ambulatory Visit: Payer: Self-pay

## 2018-02-21 DIAGNOSIS — S161XXA Strain of muscle, fascia and tendon at neck level, initial encounter: Secondary | ICD-10-CM | POA: Insufficient documentation

## 2018-02-21 DIAGNOSIS — Z7982 Long term (current) use of aspirin: Secondary | ICD-10-CM | POA: Insufficient documentation

## 2018-02-21 DIAGNOSIS — I10 Essential (primary) hypertension: Secondary | ICD-10-CM | POA: Diagnosis not present

## 2018-02-21 DIAGNOSIS — F0390 Unspecified dementia without behavioral disturbance: Secondary | ICD-10-CM | POA: Diagnosis not present

## 2018-02-21 DIAGNOSIS — J449 Chronic obstructive pulmonary disease, unspecified: Secondary | ICD-10-CM | POA: Insufficient documentation

## 2018-02-21 DIAGNOSIS — Y999 Unspecified external cause status: Secondary | ICD-10-CM | POA: Insufficient documentation

## 2018-02-21 DIAGNOSIS — Y929 Unspecified place or not applicable: Secondary | ICD-10-CM | POA: Insufficient documentation

## 2018-02-21 DIAGNOSIS — Y939 Activity, unspecified: Secondary | ICD-10-CM | POA: Diagnosis not present

## 2018-02-21 DIAGNOSIS — S199XXA Unspecified injury of neck, initial encounter: Secondary | ICD-10-CM | POA: Diagnosis present

## 2018-02-21 DIAGNOSIS — Z87891 Personal history of nicotine dependence: Secondary | ICD-10-CM | POA: Insufficient documentation

## 2018-02-21 DIAGNOSIS — Z79899 Other long term (current) drug therapy: Secondary | ICD-10-CM | POA: Insufficient documentation

## 2018-02-21 DIAGNOSIS — X58XXXA Exposure to other specified factors, initial encounter: Secondary | ICD-10-CM | POA: Insufficient documentation

## 2018-02-21 LAB — CBC
HEMATOCRIT: 35.1 % — AB (ref 36.0–46.0)
Hemoglobin: 11.6 g/dL — ABNORMAL LOW (ref 12.0–15.0)
MCH: 30.2 pg (ref 26.0–34.0)
MCHC: 33 g/dL (ref 30.0–36.0)
MCV: 91.4 fL (ref 78.0–100.0)
PLATELETS: 281 10*3/uL (ref 150–400)
RBC: 3.84 MIL/uL — AB (ref 3.87–5.11)
RDW: 13.4 % (ref 11.5–15.5)
WBC: 9.4 10*3/uL (ref 4.0–10.5)

## 2018-02-21 LAB — I-STAT CHEM 8, ED
BUN: 17 mg/dL (ref 8–23)
CHLORIDE: 101 mmol/L (ref 98–111)
Calcium, Ion: 1.22 mmol/L (ref 1.15–1.40)
Creatinine, Ser: 0.7 mg/dL (ref 0.44–1.00)
Glucose, Bld: 143 mg/dL — ABNORMAL HIGH (ref 70–99)
HCT: 33 % — ABNORMAL LOW (ref 36.0–46.0)
Hemoglobin: 11.2 g/dL — ABNORMAL LOW (ref 12.0–15.0)
POTASSIUM: 3.5 mmol/L (ref 3.5–5.1)
SODIUM: 139 mmol/L (ref 135–145)
TCO2: 27 mmol/L (ref 22–32)

## 2018-02-21 LAB — I-STAT TROPONIN, ED: Troponin i, poc: 0.03 ng/mL (ref 0.00–0.08)

## 2018-02-21 MED ORDER — LIDOCAINE 4 % EX PTCH: 1.0000 | MEDICATED_PATCH | Freq: Every day | CUTANEOUS | 0 refills | Status: AC | PRN

## 2018-02-21 MED ORDER — ACETAMINOPHEN ER 650 MG PO TBCR: 650.0000 mg | EXTENDED_RELEASE_TABLET | Freq: Three times a day (TID) | ORAL | 0 refills | Status: AC | PRN

## 2018-02-21 MED ORDER — IOPAMIDOL (ISOVUE-370) INJECTION 76%
100.0000 mL | Freq: Once | INTRAVENOUS | Status: AC | PRN
  Administered 2018-02-21: 100 mL via INTRAVENOUS

## 2018-02-21 MED ORDER — IOPAMIDOL (ISOVUE-370) INJECTION 76%
INTRAVENOUS | Status: AC
  Filled 2018-02-21: qty 100

## 2018-02-21 NOTE — Discharge Instructions (Addendum)
Results in the ER did not show any evidence of fractures, internal bleeding, heart problems. We are not 100% sure what is the cause for the neck pain, however treatment will be conservative with lidocaine patch and Tylenol.  Please follow-up with your primary care doctor for further evaluation.

## 2018-02-21 NOTE — ED Provider Notes (Signed)
[facility]-EMERGENCY DEPT Provider Note   CSN: [id number] Arrival date & time: 02/21/18  0219     History   Chief Complaint Chief Complaint  Patient presents with  . Shoulder Pain  . Neck Pain    HPI [name] is a 84 y.o. female.  HPI  84 year old female comes in with chief complaint of neck pain and shoulder pain.  Patient is a poor historian.  She reports that she is been having neck pain and shoulder pain for 1 day now.  Her pain radiates up from the scapular region up to the base of the head.  Patient denies any chest pain, shortness of breath, nausea, vomiting.  Past Medical History:  Diagnosis Date  . ASTHMA 05/18/2010  . COPD 05/18/2010  . DEMENTIA 05/18/2010  . DIABETES MELLITUS, TYPE II 05/18/2010  . DIZZINESS 06/29/2010  . GERD 05/18/2010  . HYPERTENSION 05/18/2010  . OSTEOARTHRITIS 05/18/2010    Patient Active Problem List   Diagnosis Date Noted  . DIZZINESS 06/29/2010  . DIABETES MELLITUS, TYPE II 05/18/2010  . DEMENTIA 05/18/2010  . HYPERTENSION 05/18/2010  . ASTHMA 05/18/2010  . COPD 05/18/2010  . GERD 05/18/2010  . OSTEOARTHRITIS 05/18/2010  . WEIGHT LOSS, ABNORMAL 05/18/2010    Past Surgical History:  Procedure Laterality Date  . ABDOMINAL HYSTERECTOMY    . CHOLECYSTECTOMY    . FOOT SURGERY     x's 2     OB History   None      Home Medications    Prior to Admission medications   Medication Sig Start Date End Date Taking? Authorizing Provider  albuterol (ACCUNEB) 0.63 MG/3ML nebulizer solution Take 1 ampule by nebulization every 4 (four) hours as needed for wheezing or shortness of breath.    Yes [provider]  amLODipine (NORVASC) 5 MG tablet Take 5 mg by mouth daily.   Yes [provider]  aspirin 81 MG chewable tablet Chew 81 mg by mouth daily.   Yes [provider]  cholecalciferol (VITAMIN D) 1000 UNITS tablet Take 1,000 Units by mouth daily.   Yes [provider]  donepezil (ARICEPT) 10 MG tablet Take 10 mg by mouth at bedtime.    Yes [provider]  glimepiride (AMARYL) 2 MG tablet Take 2 mg by mouth daily before breakfast.    Yes [provider]  metoCLOPramide (REGLAN) 10 MG tablet Take 10 mg by mouth every 6 (six) hours as needed for nausea or vomiting.    Yes [provider]  olmesartan-hydrochlorothiazide (BENICAR HCT) 40-25 MG per tablet Take 1 tablet by mouth daily.    Yes [provider]  trimethoprim (TRIMPEX) 100 MG tablet Take 100 mg by mouth daily.    Yes [provider]  acetaminophen (TYLENOL 8 HOUR) 650 MG CR tablet Take 1 tablet (650 mg total) by mouth every 8 (eight) hours as needed for pain. 02/21/18   Derwood Kaplan, MD  cephALEXin (KEFLEX) 500 MG capsule Take 1 capsule (500 mg total) by mouth 2 (two) times daily. Patient not taking: Reported on 02/21/2018 08/06/16   Pisciotta, Joni Reining, PA-C  Lidocaine (QC LIDOCAINE PAIN RELIEF) 4 % PTCH Apply 1 patch topically daily as needed for up to 12 days. 02/21/18 03/05/18  Derwood Kaplan, MD    Family History Family History  Problem Relation Age of Onset  . Arthritis Father   . Arthritis Mother     Social History Social History   Tobacco Use  . Smoking  status: Former Smoker    Types: Cigarettes    Last attempt to quit: 06/20/1968    Years since quitting: 49.7  . Smokeless tobacco: Never Used  Substance Use Topics  . Alcohol use: No  . Drug use: No     Allergies   Morphine; Penicillins; and Codeine   Review of Systems Review of Systems  Unable to perform ROS: Dementia     Physical Exam Updated Vital Signs BP (!) 128/101   Pulse 70   Temp 98 F (36.7 C) (Oral)   Resp (!) 21   Ht 5\' 5"  (1.651 m)   Wt 64 kg   SpO2 100%   BMI 23.46 kg/m   Physical Exam  Constitutional: She appears well-developed.  HENT:  Head: Atraumatic.  Eyes: EOM are normal.  Neck: Neck supple.  Patient has some neck stiffness and tenderness with  turning her head  Cardiovascular: Normal rate.  Pulmonary/Chest: Effort normal.  Abdominal: Bowel sounds are normal.  Neurological: She is alert.  Skin: Skin is warm and dry.  Nursing note and vitals reviewed.    ED Treatments / Results  Labs (all labs ordered are listed, but only abnormal results are displayed) Labs Reviewed  CBC - Abnormal; Notable for the following components:      Result Value   RBC 3.84 (*)    Hemoglobin 11.6 (*)    HCT 35.1 (*)    All other components within normal limits  I-STAT CHEM 8, ED - Abnormal; Notable for the following components:   Glucose, Bld 143 (*)    Hemoglobin 11.2 (*)    HCT 33.0 (*)    All other components within normal limits  I-STAT TROPONIN, ED    EKG EKG Interpretation  Date/Time:  Wednesday February 21 2018 07:59:51 EDT Ventricular Rate:  64 PR Interval:    QRS Duration: 89 QT Interval:  434 QTC Calculation: 448 R Axis:   -51 Text Interpretation:  Sinus rhythm Multiple premature complexes, vent & supraven Left anterior fascicular block Low voltage, extremity and precordial leads Probable anteroseptal infarct, old No acute changes Confirmed by Derwood Kaplan [phone]) on 02/21/2018 8:06:28 AM   Radiology Ct Angio Head W Or Wo Contrast  Result Date: 02/21/2018 CLINICAL DATA:  Right shoulder pain extending to the head. EXAM: CT ANGIOGRAPHY HEAD AND NECK TECHNIQUE: Multidetector CT imaging of the head and neck was performed using the standard protocol during bolus administration of intravenous contrast. Multiplanar CT image reconstructions and MIPs were obtained to evaluate the vascular anatomy. Carotid stenosis measurements (when applicable) are obtained utilizing NASCET criteria, using the distal internal carotid diameter as the denominator. CONTRAST:  ISOVUE-370 IOPAMIDOL (ISOVUE-370) INJECTION 76% COMPARISON:  Head CT 02/23/2012 FINDINGS: CT HEAD FINDINGS Brain: No evidence of acute infarction, hemorrhage, hydrocephalus,  extra-axial collection or mass lesion/mass effect. Extensive low-density in the cerebral white matter attributed to chronic small vessel ischemia. Vascular: See below Skull: No acute or aggressive finding Sinuses: Negative Orbits: Negative Review of the MIP images confirms the above findings CTA NECK FINDINGS Aortic arch: 3 vessel branching. Mild atherosclerotic plaque. Right carotid system: Mild atherosclerotic plaque at the ICA bulb. No stenosis, ulceration, or beading. Left carotid system: Minimal atheromatous changes. No stenosis or ulceration. Vertebral arteries: No proximal subclavian stenosis. There is extrinsic narrowing of the left vertebral artery at C2-3 that appears to be secondary to spurring and tortuosity based on coronal reformats. Skeleton: C4-5 and C5-6 facet ankylosis. Other neck: No evidence of mass or inflammation Upper  chest: Negative Review of the MIP images confirms the above findings CTA HEAD FINDINGS Anterior circulation: Minimal atheromatous changes. Infundibulum at the posterior communicating arteries bilaterally. Negative for aneurysm. No beading or stenosis. Posterior circulation: Vessels are smooth and widely patent. No aneurysm. Venous sinuses: Patent. Dominant right transverse sigmoid sinuses. Anatomic variants: None significant Delayed phase: No abnormal intracranial enhancement Review of the MIP images confirms the above findings IMPRESSION: No acute finding.  Negative CTA. Electronically Signed   By: Marnee Spring M.D.   On: 02/21/2018 07:10   Ct Angio Neck W And/or Wo Contrast  Result Date: 02/21/2018 CLINICAL DATA:  Right shoulder pain extending to the head. EXAM: CT ANGIOGRAPHY HEAD AND NECK TECHNIQUE: Multidetector CT imaging of the head and neck was performed using the standard protocol during bolus administration of intravenous contrast. Multiplanar CT image reconstructions and MIPs were obtained to evaluate the vascular anatomy. Carotid stenosis measurements (when  applicable) are obtained utilizing NASCET criteria, using the distal internal carotid diameter as the denominator. CONTRAST:  ISOVUE-370 IOPAMIDOL (ISOVUE-370) INJECTION 76% COMPARISON:  Head CT 02/23/2012 FINDINGS: CT HEAD FINDINGS Brain: No evidence of acute infarction, hemorrhage, hydrocephalus, extra-axial collection or mass lesion/mass effect. Extensive low-density in the cerebral white matter attributed to chronic small vessel ischemia. Vascular: See below Skull: No acute or aggressive finding Sinuses: Negative Orbits: Negative Review of the MIP images confirms the above findings CTA NECK FINDINGS Aortic arch: 3 vessel branching. Mild atherosclerotic plaque. Right carotid system: Mild atherosclerotic plaque at the ICA bulb. No stenosis, ulceration, or beading. Left carotid system: Minimal atheromatous changes. No stenosis or ulceration. Vertebral arteries: No proximal subclavian stenosis. There is extrinsic narrowing of the left vertebral artery at C2-3 that appears to be secondary to spurring and tortuosity based on coronal reformats. Skeleton: C4-5 and C5-6 facet ankylosis. Other neck: No evidence of mass or inflammation Upper chest: Negative Review of the MIP images confirms the above findings CTA HEAD FINDINGS Anterior circulation: Minimal atheromatous changes. Infundibulum at the posterior communicating arteries bilaterally. Negative for aneurysm. No beading or stenosis. Posterior circulation: Vessels are smooth and widely patent. No aneurysm. Venous sinuses: Patent. Dominant right transverse sigmoid sinuses. Anatomic variants: None significant Delayed phase: No abnormal intracranial enhancement Review of the MIP images confirms the above findings IMPRESSION: No acute finding.  Negative CTA. Electronically Signed   By: Marnee Spring M.D.   On: 02/21/2018 07:10    Procedures Procedures (including critical care time)  Medications Ordered in ED Medications  iopamidol (ISOVUE-370) 76 %  injection 100 mL (100 mLs Intravenous Contrast Given 02/21/18 0620)     Initial Impression / Assessment and Plan / ED Course  I have reviewed the triage vital signs and the nursing notes.  Pertinent labs & imaging results that were available during my care of the patient were reviewed by me and considered in my medical decision making (see chart for details).  Clinical Course as of Feb 21 834  Wed Feb 21, 2018  1610 Results from the ER workup discussed with the patient face to face and all questions answered to the best of my ability.    [AN]    Clinical Course User Index [AN] Derwood Kaplan, MD    84 year old female brought into the ER with chief complaint of neck pain.  Patient's pain is radiating up from the scapular region up to the back of her head.  Patient is noted to have tenderness with movement of her neck.  CT angiogram ordered to ensure  there is no posterior circulation dissection or aneurysm.  Troponin and EKG are reassuring.  Final Clinical Impressions(s) / ED Diagnoses   Final diagnoses:  Acute strain of neck muscle, initial encounter    ED Discharge Orders         Ordered    acetaminophen (TYLENOL 8 HOUR) 650 MG CR tablet  Every 8 hours PRN     02/21/18 0832    Lidocaine (QC LIDOCAINE PAIN RELIEF) 4 % PTCH  Daily PRN     02/21/18 4098           Derwood Kaplan, MD 02/21/18 [phone]

## 2018-02-21 NOTE — ED Triage Notes (Signed)
Patient complaining of pain that is going from right shoulder to her head. Patient states she woke up tonight with this pain.

## 2018-03-04 ENCOUNTER — Other Ambulatory Visit: Payer: Self-pay

## 2018-03-04 ENCOUNTER — Emergency Department (HOSPITAL_COMMUNITY): Payer: Medicare (Managed Care)

## 2018-03-04 ENCOUNTER — Emergency Department (HOSPITAL_COMMUNITY)
Admission: EM | Admit: 2018-03-04 | Discharge: 2018-03-04 | Disposition: A | Discharge status: Home / Self Care | Payer: Medicare (Managed Care) | Attending: Emergency Medicine | Admitting: Emergency Medicine

## 2018-03-04 ENCOUNTER — Encounter (HOSPITAL_COMMUNITY): Payer: Self-pay | Admitting: *Deleted

## 2018-03-04 DIAGNOSIS — Z87891 Personal history of nicotine dependence: Secondary | ICD-10-CM | POA: Diagnosis not present

## 2018-03-04 DIAGNOSIS — Z79899 Other long term (current) drug therapy: Secondary | ICD-10-CM | POA: Diagnosis not present

## 2018-03-04 DIAGNOSIS — E119 Type 2 diabetes mellitus without complications: Secondary | ICD-10-CM | POA: Insufficient documentation

## 2018-03-04 DIAGNOSIS — M79674 Pain in right toe(s): Secondary | ICD-10-CM

## 2018-03-04 DIAGNOSIS — Z9049 Acquired absence of other specified parts of digestive tract: Secondary | ICD-10-CM | POA: Insufficient documentation

## 2018-03-04 DIAGNOSIS — Z7982 Long term (current) use of aspirin: Secondary | ICD-10-CM | POA: Diagnosis not present

## 2018-03-04 DIAGNOSIS — F0390 Unspecified dementia without behavioral disturbance: Secondary | ICD-10-CM | POA: Diagnosis not present

## 2018-03-04 DIAGNOSIS — I10 Essential (primary) hypertension: Secondary | ICD-10-CM | POA: Insufficient documentation

## 2018-03-04 DIAGNOSIS — J45909 Unspecified asthma, uncomplicated: Secondary | ICD-10-CM | POA: Diagnosis not present

## 2018-03-04 DIAGNOSIS — Z7984 Long term (current) use of oral hypoglycemic drugs: Secondary | ICD-10-CM | POA: Diagnosis not present

## 2018-03-04 DIAGNOSIS — M79671 Pain in right foot: Secondary | ICD-10-CM | POA: Diagnosis not present

## 2018-03-04 LAB — URIC ACID: URIC ACID, SERUM: 5 mg/dL (ref 2.5–7.1)

## 2018-03-04 MED ORDER — KETOROLAC TROMETHAMINE 15 MG/ML IJ SOLN
15.0000 mg | Freq: Once | INTRAMUSCULAR | Status: AC
Start: 2018-03-04 — End: 2018-03-04
  Administered 2018-03-04: 15 mg via INTRAMUSCULAR
  Filled 2018-03-04: qty 1

## 2018-03-04 MED ORDER — TRAMADOL-ACETAMINOPHEN 37.5-325 MG PO TABS: 1.0000 | ORAL_TABLET | Freq: Four times a day (QID) | ORAL | 0 refills | Status: DC | PRN

## 2018-03-04 NOTE — ED Provider Notes (Signed)
9:25 AM.  Evaluation after return of uric acid, which is normal.  Reviewed x-rays that do not show fracture or degenerative changes.  Right foot is mildly tender without deformity.  The tenderness is diffuse.  She also has diffuse tenderness in the left foot which has no deformity.  Reportedly by the patient's daughter was at the bedside, the patient complains only of right toe pain.  Pain started this morning and did not improve when she was given Tylenol.  The daughter is seeking something stronger than that for pain.  [facility]  Assessment-nonspecific musculoskeletal pain, gout, significant degenerative joint disease, or septic arthritis.  Plan-primary treatment for this type of problem with the heat applications regularly.  Tylenol is also a good pain reliever.  Patient will be given a very short prescription for Ultracet to use if needed for severe pain.   Mancel BaleWentz, Mattia Osterman, MD 03/04/18 0930

## 2018-03-04 NOTE — ED Triage Notes (Signed)
Daughter stated "my mother woke up around 1:30 c/o right foot pain.  She's a diabetic so I wanted her checked out."

## 2018-03-04 NOTE — Discharge Instructions (Signed)
The best treatment for the type of pain she is having is frequent heat applications with Tylenol every 4 hours.  We are giving her a short-term prescription for narcotic pain reliever to use if needed instead of the Tylenol.  It is important [facility].  Have them evaluate her right foot and toe for symptoms and progression of the discomfort.

## 2018-03-04 NOTE — ED Provider Notes (Signed)
[facility]-EMERGENCY DEPT Provider Note   CSN: [id number] Arrival date & time: 03/04/18  0226  Time seen 05:10 AM   History   Chief Complaint Chief Complaint  Patient presents with  . Foot Pain    right   Level 5 caveat for dementia  HPI [name] is a 84 y.o. female.  HPI daughter states patient woke up around 1 AM complaining of pain in her right foot extending down into her toes.  They deny any known injury.  She is never had pain like this before.  She does have osteoarthritis.  She does not have a history of gout.  She has not had fever.  PCP Jethro Bastos, MD   Past Medical History:  Diagnosis Date  . ASTHMA 05/18/2010  . COPD 05/18/2010  . DEMENTIA 05/18/2010  . DIABETES MELLITUS, TYPE II 05/18/2010  . DIZZINESS 06/29/2010  . GERD 05/18/2010  . HYPERTENSION 05/18/2010  . OSTEOARTHRITIS 05/18/2010    Patient Active Problem List   Diagnosis Date Noted  . DIZZINESS 06/29/2010  . DIABETES MELLITUS, TYPE II 05/18/2010  . DEMENTIA 05/18/2010  . HYPERTENSION 05/18/2010  . ASTHMA 05/18/2010  . COPD 05/18/2010  . GERD 05/18/2010  . OSTEOARTHRITIS 05/18/2010  . WEIGHT LOSS, ABNORMAL 05/18/2010    Past Surgical History:  Procedure Laterality Date  . ABDOMINAL HYSTERECTOMY    . CHOLECYSTECTOMY    . FOOT SURGERY     x's 2     OB History   None      Home Medications    Prior to Admission medications   Medication Sig Start Date End Date Taking? Authorizing Provider  acetaminophen (TYLENOL 8 HOUR) 650 MG CR tablet Take 1 tablet (650 mg total) by mouth every 8 (eight) hours as needed for pain. 02/21/18   Derwood Kaplan, MD  albuterol (ACCUNEB) 0.63 MG/3ML nebulizer solution Take 1 ampule by nebulization every 4 (four) hours as needed for wheezing or shortness of breath.     [provider]  amLODipine (NORVASC) 5 MG tablet Take 5 mg by mouth daily.    [provider]  aspirin 81 MG chewable tablet Chew 81  mg by mouth daily.    [provider]  cephALEXin (KEFLEX) 500 MG capsule Take 1 capsule (500 mg total) by mouth 2 (two) times daily. Patient not taking: Reported on 02/21/2018 08/06/16   Pisciotta, Joni Reining, PA-C  cholecalciferol (VITAMIN D) 1000 UNITS tablet Take 1,000 Units by mouth daily.    [provider]  donepezil (ARICEPT) 10 MG tablet Take 10 mg by mouth at bedtime.     [provider]  glimepiride (AMARYL) 2 MG tablet Take 2 mg by mouth daily before breakfast.     [provider]  Lidocaine (QC LIDOCAINE PAIN RELIEF) 4 % PTCH Apply 1 patch topically daily as needed for up to 12 days. 02/21/18 03/05/18  Derwood Kaplan, MD  metoCLOPramide (REGLAN) 10 MG tablet Take 10 mg by mouth every 6 (six) hours as needed for nausea or vomiting.     [provider]  olmesartan-hydrochlorothiazide (BENICAR HCT) 40-25 MG per tablet Take 1 tablet by mouth daily.     [provider]  trimethoprim (TRIMPEX) 100 MG tablet Take 100 mg by mouth daily.     [provider]    Family History Family History  Problem Relation Age of Onset  . Arthritis Father   . Arthritis Mother     Social History Social History  Tobacco Use  . Smoking status: Former Smoker    Types: Cigarettes    Last attempt to quit: 06/20/1968    Years since quitting: 49.7  . Smokeless tobacco: Never Used  Substance Use Topics  . Alcohol use: No  . Drug use: No  lives at home Lives with daughter   Allergies   Morphine; Penicillins; and Codeine   Review of Systems Review of Systems  Unable to perform ROS: Dementia     Physical Exam Updated Vital Signs BP 125/77 (BP Location: Left Arm)   Pulse 68   Temp 98.2 F (36.8 C) (Oral)   Ht 5\' 5"  (1.651 m)   Wt 64 kg   SpO2 98%   BMI 23.46 kg/m   Vital signs normal   Physical Exam  Constitutional: She appears well-developed and well-nourished. No distress.  HENT:  Head: Normocephalic and atraumatic.  Right  Ear: External ear normal.  Left Ear: External ear normal.  Nose: Nose normal.  Eyes: Conjunctivae and EOM are normal.  Neck: Normal range of motion.  Cardiovascular: Normal rate.  Pulmonary/Chest: Effort normal.  Musculoskeletal: Normal range of motion. She exhibits tenderness.  There is no obvious difference between her right and left legs.  When I palpate her leg she mainly is reacting because my hands are cold.  She cannot localize the pain.  Her temperature is normal as they come down her leg.  There is no paleness or duskiness noted to her toes.  Capillary refill is normal.  Neurological: She is alert. No cranial nerve deficit.  Skin: Skin is warm and dry. No erythema.  Psychiatric: She has a normal mood and affect. Her behavior is normal.  Nursing note and vitals reviewed.      ED Treatments / Results  Labs (all labs ordered are listed, but only abnormal results are displayed)  Uric acid level pending    Results for orders placed or performed during the hospital encounter of 02/21/18  CBC  Result Value Ref Range   WBC 9.4 4.0 - 10.5 K/uL   RBC 3.84 (L) 3.87 - 5.11 MIL/uL   Hemoglobin 11.6 (L) 12.0 - 15.0 g/dL   HCT 29.535.1 (L) 62.136.0 - 30.846.0 %   MCV 91.4 78.0 - 100.0 fL   MCH 30.2 26.0 - 34.0 pg   MCHC 33.0 30.0 - 36.0 g/dL   RDW 65.713.4 84.611.5 - 96.215.5 %   Platelets 281 150 - 400 K/uL  I-stat chem 8, ed  Result Value Ref Range   Sodium 139 135 - 145 mmol/L   Potassium 3.5 3.5 - 5.1 mmol/L   Chloride 101 98 - 111 mmol/L   BUN 17 8 - 23 mg/dL   Creatinine, Ser 9.520.70 0.44 - 1.00 mg/dL   Glucose, Bld 841143 (H) 70 - 99 mg/dL   Calcium, Ion 3.241.22 4.011.15 - 1.40 mmol/L   TCO2 27 22 - 32 mmol/L   Hemoglobin 11.2 (L) 12.0 - 15.0 g/dL   HCT 02.733.0 (L) 25.336.0 - 66.446.0 %  I-stat troponin, ED  Result Value Ref Range   Troponin i, poc 0.03 0.00 - 0.08 ng/mL   Comment 3              EKG None  Radiology Dg Foot Complete Right  Result Date: 03/04/2018 CLINICAL DATA:  Woke up with RIGHT  foot pain. No injury. History of diabetes. EXAM: RIGHT FOOT COMPLETE - 3+ VIEW COMPARISON:  None. FINDINGS: No fracture deformity or dislocation. First IP arthrodesis. Osteopenia without destructive  bony lesions. No advanced arthrosis for age. Mild base of fifth metatarsal enthesopathy. IMPRESSION: No acute fracture deformity or dislocation. Osteopenia decreases sensitivity for acute nondisplaced fractures. Electronically Signed   By: Awilda Metro M.D.   On: 03/04/2018 05:49    Procedures Procedures (including critical care time)  Medications Ordered in ED Medications  ketorolac (TORADOL) 15 MG/ML injection 15 mg (15 mg Intramuscular Given 03/04/18 0559)     Initial Impression / Assessment and Plan / ED Course  I have reviewed the triage vital signs and the nursing notes.  Pertinent labs & imaging results that were available during my care of the patient were reviewed by me and considered in my medical decision making (see chart for details).     Patient was given Toradol 15 mg IM.  Uric acid level was sent to check for gout.  I do not suspect any ischemic problems causing this pain.  Patient was just seen in the ED on September 4 with neck and shoulder pain.  Daughter states she has osteoarthritis.  Her BUN was 17 and creatinine was 0.7 at that time.  7:45 AM nursing staff thought the uric acid was a urine test.  They were going to draw her blood now.  Her blood work was ordered 2 hours ago.  Recheck patient at 8 AM she is smiling now and appears much more comfortable.  The delay was explained to her daughter.  8:06 AM Dr. Effie Shy will follow up on her uric acid level and decide her discharge medications.  Final Clinical Impressions(s) / ED Diagnoses   Final diagnoses:  Foot pain, right    Disposition pending  Devoria Albe, MD, Concha Pyo, MD 03/04/18 [id number]

## 2018-03-04 NOTE — ED Notes (Signed)
Daughter denies pt having experienced injury/trauma to right foot.

## 2018-05-13 ENCOUNTER — Emergency Department (HOSPITAL_COMMUNITY)
Admission: EM | Admit: 2018-05-13 | Discharge: 2018-05-13 | Disposition: A | Discharge status: Home / Self Care | Payer: Medicare (Managed Care) | Attending: Emergency Medicine | Admitting: Emergency Medicine

## 2018-05-13 ENCOUNTER — Emergency Department (HOSPITAL_COMMUNITY): Payer: Medicare (Managed Care)

## 2018-05-13 ENCOUNTER — Encounter (HOSPITAL_COMMUNITY): Payer: Self-pay

## 2018-05-13 ENCOUNTER — Other Ambulatory Visit: Payer: Self-pay

## 2018-05-13 DIAGNOSIS — J449 Chronic obstructive pulmonary disease, unspecified: Secondary | ICD-10-CM | POA: Insufficient documentation

## 2018-05-13 DIAGNOSIS — Z7982 Long term (current) use of aspirin: Secondary | ICD-10-CM | POA: Diagnosis not present

## 2018-05-13 DIAGNOSIS — Z87891 Personal history of nicotine dependence: Secondary | ICD-10-CM | POA: Diagnosis not present

## 2018-05-13 DIAGNOSIS — Z79899 Other long term (current) drug therapy: Secondary | ICD-10-CM | POA: Diagnosis not present

## 2018-05-13 DIAGNOSIS — F0390 Unspecified dementia without behavioral disturbance: Secondary | ICD-10-CM | POA: Diagnosis not present

## 2018-05-13 DIAGNOSIS — K5289 Other specified noninfective gastroenteritis and colitis: Secondary | ICD-10-CM | POA: Diagnosis not present

## 2018-05-13 DIAGNOSIS — K5730 Diverticulosis of large intestine without perforation or abscess without bleeding: Secondary | ICD-10-CM | POA: Insufficient documentation

## 2018-05-13 DIAGNOSIS — R197 Diarrhea, unspecified: Secondary | ICD-10-CM

## 2018-05-13 DIAGNOSIS — I10 Essential (primary) hypertension: Secondary | ICD-10-CM | POA: Insufficient documentation

## 2018-05-13 DIAGNOSIS — K529 Noninfective gastroenteritis and colitis, unspecified: Secondary | ICD-10-CM

## 2018-05-13 DIAGNOSIS — K5790 Diverticulosis of intestine, part unspecified, without perforation or abscess without bleeding: Secondary | ICD-10-CM

## 2018-05-13 DIAGNOSIS — R112 Nausea with vomiting, unspecified: Secondary | ICD-10-CM

## 2018-05-13 DIAGNOSIS — E119 Type 2 diabetes mellitus without complications: Secondary | ICD-10-CM | POA: Diagnosis not present

## 2018-05-13 DIAGNOSIS — R0602 Shortness of breath: Secondary | ICD-10-CM | POA: Diagnosis not present

## 2018-05-13 DIAGNOSIS — R1084 Generalized abdominal pain: Secondary | ICD-10-CM

## 2018-05-13 LAB — CBC WITH DIFFERENTIAL/PLATELET
Abs Immature Granulocytes: 0.04 10*3/uL (ref 0.00–0.07)
BASOS ABS: 0 10*3/uL (ref 0.0–0.1)
Basophils Relative: 0 %
EOS ABS: 0.1 10*3/uL (ref 0.0–0.5)
Eosinophils Relative: 1 %
HEMATOCRIT: 41.7 % (ref 36.0–46.0)
Hemoglobin: 13.1 g/dL (ref 12.0–15.0)
IMMATURE GRANULOCYTES: 0 %
LYMPHS ABS: 0.4 10*3/uL — AB (ref 0.7–4.0)
LYMPHS PCT: 3 %
MCH: 29.6 pg (ref 26.0–34.0)
MCHC: 31.4 g/dL (ref 30.0–36.0)
MCV: 94.1 fL (ref 80.0–100.0)
Monocytes Absolute: 0.4 10*3/uL (ref 0.1–1.0)
Monocytes Relative: 3 %
NEUTROS PCT: 93 %
NRBC: 0 % (ref 0.0–0.2)
Neutro Abs: 12.5 10*3/uL — ABNORMAL HIGH (ref 1.7–7.7)
Platelets: 254 10*3/uL (ref 150–400)
RBC: 4.43 MIL/uL (ref 3.87–5.11)
RDW: 12.1 % (ref 11.5–15.5)
WBC: 13.4 10*3/uL — ABNORMAL HIGH (ref 4.0–10.5)

## 2018-05-13 LAB — COMPREHENSIVE METABOLIC PANEL
ALBUMIN: 4.1 g/dL (ref 3.5–5.0)
ALT: 12 U/L (ref 0–44)
ANION GAP: 11 (ref 5–15)
AST: 19 U/L (ref 15–41)
Alkaline Phosphatase: 73 U/L (ref 38–126)
BUN: 17 mg/dL (ref 8–23)
CHLORIDE: 104 mmol/L (ref 98–111)
CO2: 26 mmol/L (ref 22–32)
Calcium: 10 mg/dL (ref 8.9–10.3)
Creatinine, Ser: 0.83 mg/dL (ref 0.44–1.00)
GFR calc Af Amer: >60 mL/min (ref >60)
GFR calc non Af Amer: >60 mL/min (ref >60)
GLUCOSE: 168 mg/dL — AB (ref 70–99)
POTASSIUM: 4.2 mmol/L (ref 3.5–5.1)
Sodium: 141 mmol/L (ref 135–145)
Total Bilirubin: 0.6 mg/dL (ref 0.3–1.2)
Total Protein: 7.6 g/dL (ref 6.5–8.1)

## 2018-05-13 LAB — I-STAT TROPONIN, ED: Troponin i, poc: 0 ng/mL (ref 0.00–0.08)

## 2018-05-13 LAB — LIPASE, BLOOD: Lipase: 32 U/L (ref 11–51)

## 2018-05-13 MED ORDER — IOHEXOL 300 MG/ML  SOLN
100.0000 mL | Freq: Once | INTRAMUSCULAR | Status: AC | PRN
  Administered 2018-05-13: 100 mL via INTRAVENOUS

## 2018-05-13 MED ORDER — ACETAMINOPHEN 325 MG PO TABS
650.0000 mg | ORAL_TABLET | Freq: Once | ORAL | Status: AC
  Administered 2018-05-13: 650 mg via ORAL
  Filled 2018-05-13: qty 2

## 2018-05-13 MED ORDER — ONDANSETRON 4 MG PO TBDP: 4.0000 mg | ORAL_TABLET | Freq: Three times a day (TID) | ORAL | 0 refills | Status: DC | PRN

## 2018-05-13 MED ORDER — ONDANSETRON HCL 4 MG/2ML IJ SOLN: 4.0000 mg | Freq: Once | INTRAMUSCULAR | Status: DC

## 2018-05-13 MED ORDER — SODIUM CHLORIDE 0.9 % IV BOLUS
500.0000 mL | Freq: Once | INTRAVENOUS | Status: AC
  Administered 2018-05-13: 500 mL via INTRAVENOUS

## 2018-05-13 NOTE — ED Provider Notes (Signed)
[facility] EMERGENCY DEPARTMENT Provider Note   CSN: [id number] Arrival date & time: 05/13/18  1317     History   Chief Complaint Chief Complaint  Patient presents with  . Emesis  . Diarrhea  . Shortness of Breath    HPI [name] is a 84 y.o. female who presents today with her daughter for evaluation of emesis, nausea, diarrhea, and shortness of breath that started today at around noon.  Patient woke up feeling okay however is gotten worse as things went on.  She denies any chest pain diaphoresis or dizziness.  History is provided by patient and her daughter.  Patient has a history of dementia.  Daughter reports that she and a niece have been sick recently with similar type symptoms.  Patient has not had a fever while she has been here.  Denies any nasal congestion or cough.  She reports that she has had 3 episodes of vomiting and about 3 episodes of diarrhea.  No blood in either of them.  She started getting short of breath after she was vomiting.  No dysuria hematuria or other urinary symptoms.  HPI  Past Medical History:  Diagnosis Date  . ASTHMA 05/18/2010  . COPD 05/18/2010  . DEMENTIA 05/18/2010  . DIABETES MELLITUS, TYPE II 05/18/2010  . DIZZINESS 06/29/2010  . GERD 05/18/2010  . HYPERTENSION 05/18/2010  . OSTEOARTHRITIS 05/18/2010    Patient Active Problem List   Diagnosis Date Noted  . DIZZINESS 06/29/2010  . DIABETES MELLITUS, TYPE II 05/18/2010  . DEMENTIA 05/18/2010  . HYPERTENSION 05/18/2010  . ASTHMA 05/18/2010  . COPD 05/18/2010  . GERD 05/18/2010  . OSTEOARTHRITIS 05/18/2010  . WEIGHT LOSS, ABNORMAL 05/18/2010    Past Surgical History:  Procedure Laterality Date  . ABDOMINAL HYSTERECTOMY    . CHOLECYSTECTOMY    . FOOT SURGERY     x's 2     OB History   None      Home Medications    Prior to Admission medications   Medication Sig Start Date End Date Taking? Authorizing Provider  acetaminophen (TYLENOL 8 HOUR)  650 MG CR tablet Take 1 tablet (650 mg total) by mouth every 8 (eight) hours as needed for pain. 02/21/18   Derwood Kaplan, MD  albuterol (ACCUNEB) 0.63 MG/3ML nebulizer solution Take 1 ampule by nebulization every 4 (four) hours as needed for wheezing or shortness of breath.     [provider]  amLODipine (NORVASC) 5 MG tablet Take 5 mg by mouth daily.    [provider]  aspirin 81 MG chewable tablet Chew 81 mg by mouth daily.    [provider]  cephALEXin (KEFLEX) 500 MG capsule Take 1 capsule (500 mg total) by mouth 2 (two) times daily. Patient not taking: Reported on 02/21/2018 08/06/16   Pisciotta, Joni Reining, PA-C  cholecalciferol (VITAMIN D) 1000 UNITS tablet Take 1,000 Units by mouth daily.    [provider]  donepezil (ARICEPT) 10 MG tablet Take 10 mg by mouth at bedtime.     [provider]  glimepiride (AMARYL) 2 MG tablet Take 2 mg by mouth daily before breakfast.     [provider]  metoCLOPramide (REGLAN) 10 MG tablet Take 10 mg by mouth every 6 (six) hours as needed for nausea or vomiting.     [provider]  olmesartan-hydrochlorothiazide (BENICAR HCT) 40-25 MG per tablet Take 1 tablet by mouth daily.     [provider]  ondansetron (ZOFRAN ODT)  4 MG disintegrating tablet Take 1 tablet (4 mg total) by mouth every 8 (eight) hours as needed for nausea or vomiting. 05/13/18   Cristina Gong, PA-C  traMADol-acetaminophen (ULTRACET) 37.5-325 MG tablet Take 1 tablet by mouth every 6 (six) hours as needed for moderate pain. 03/04/18   Mancel Bale, MD  trimethoprim (TRIMPEX) 100 MG tablet Take 100 mg by mouth daily.     [provider]    Family History Family History  Problem Relation Age of Onset  . Arthritis Father   . Arthritis Mother     Social History Social History   Tobacco Use  . Smoking status: Former Smoker    Types: Cigarettes    Last attempt to quit: 06/20/1968    Years since  quitting: 49.9  . Smokeless tobacco: Never Used  Substance Use Topics  . Alcohol use: No  . Drug use: No     Allergies   Morphine; Penicillins; and Codeine   Review of Systems Review of Systems  Constitutional: Positive for fatigue. Negative for chills and fever.  Respiratory: Positive for shortness of breath. Negative for cough, chest tightness and wheezing.   Cardiovascular: Negative for chest pain.  Gastrointestinal: Positive for abdominal pain, diarrhea, nausea and vomiting. Negative for abdominal distention and blood in stool.  Genitourinary: Negative for dysuria and pelvic pain.  Musculoskeletal: Negative for back pain and neck pain.  Neurological: Positive for weakness (Generalized). Negative for headaches.  All other systems reviewed and are negative.    Physical Exam Updated Vital Signs BP 113/69 (BP Location: Right Arm)   Pulse 89   Temp 97.7 F (36.5 C) (Oral)   Resp 15   Ht 5\' 1"  (1.549 m)   Wt 63.5 kg   SpO2 99%   BMI 26.45 kg/m   Physical Exam  Constitutional: She is oriented to person, place, and time. She appears ill.  Frail, elderly  HENT:  Head: Normocephalic and atraumatic.  Mouth/Throat: Oropharynx is clear and moist.  Eyes: Conjunctivae are normal.  Neck: Normal range of motion. Neck supple.  Cardiovascular: Normal rate, regular rhythm, normal heart sounds and intact distal pulses.  No murmur heard. Pulmonary/Chest: Effort normal and breath sounds normal. No respiratory distress. She has no decreased breath sounds. She has no wheezes. She has no rhonchi. She has no rales.  Abdominal: Soft. She exhibits no distension. There is tenderness in the right upper quadrant, epigastric area, left upper quadrant and left lower quadrant. There is no guarding.  Musculoskeletal: She exhibits no edema.       Right lower leg: Normal. She exhibits no edema.       Left lower leg: Normal. She exhibits no edema.  Neurological: She is alert and oriented to person,  place, and time. She is not disoriented.  Skin: Skin is warm and dry.  Psychiatric: She has a normal mood and affect.  Nursing note and vitals reviewed.    ED Treatments / Results  Labs (all labs ordered are listed, but only abnormal results are displayed) Labs Reviewed  COMPREHENSIVE METABOLIC PANEL - Abnormal; Notable for the following components:      Result Value   Glucose, Bld 168 (*)    All other components within normal limits  CBC WITH DIFFERENTIAL/PLATELET - Abnormal; Notable for the following components:   WBC 13.4 (*)    Neutro Abs 12.5 (*)    Lymphs Abs 0.4 (*)    All other components within normal limits  LIPASE, BLOOD  URINALYSIS,  ROUTINE W REFLEX MICROSCOPIC  I-STAT TROPONIN, ED    EKG EKG Interpretation  Date/Time:  Sunday May 13 2018 13:30:31 EST Ventricular Rate:  72 PR Interval:    QRS Duration: 95 QT Interval:  399 QTC Calculation: 437 R Axis:   -84 Text Interpretation:  Sinus arrhythmia Left anterior fascicular block Consider right ventricular hypertrophy Consider anterior infarct Confirmed by Vanetta Mulders [phone]) on 05/13/2018 3:37:43 PM   Radiology Dg Chest 2 View  Result Date: 05/13/2018 CLINICAL DATA:  Sudden onset of shortness of breath, vomiting and diarrhea since this morning, history hypertension, dementia, asthma, COPD EXAM: CHEST - 2 VIEW COMPARISON:  02/09/2015 FINDINGS: Minimal enlargement of cardiac silhouette. Mediastinal contours and pulmonary vascularity normal. Subsegmental atelectasis at RIGHT base. Lungs hyperinflated but otherwise clear. No infiltrate, pleural effusion or pneumothorax. Bones demineralized with scattered degenerative changes of the thoracic spine and RIGHT glenohumeral joint. IMPRESSION: Emphysematous changes with minimal RIGHT basilar atelectasis. Electronically Signed   By: Ulyses Southward M.D.   On: 05/13/2018 15:52   Ct Abdomen Pelvis W Contrast  Result Date: 05/13/2018 CLINICAL DATA:  84 y/o  F; nausea  and vomiting. EXAM: CT ABDOMEN AND PELVIS WITH CONTRAST TECHNIQUE: Multidetector CT imaging of the abdomen and pelvis was performed using the standard protocol following bolus administration of intravenous contrast. CONTRAST:  OMNIPAQUE IOHEXOL 300 MG/ML  SOLN COMPARISON:  08/06/2017, 06/09/2010, 08/05/2004 CT abdomen and pelvis. FINDINGS: Lower chest: Platelike atelectasis within the lower lobes bilaterally. Hepatobiliary: No focal liver abnormality is seen. Status post cholecystectomy. Stable mild intra and extrahepatic biliary ductal dilatation with the common bile duct measuring to 8 mm, likely compensatory post cholecystectomy. Pancreas: Unremarkable. No surrounding inflammatory changes. Stable focal dilatation of the main pancreatic duct in the pancreas head to 5 mm. Spleen: Normal in size without focal abnormality. Adrenals/Urinary Tract: Normal adrenal glands. Small foci of cortical scarring within the right kidney. Bilateral renal cysts measuring up to 1.2 cm in the right interpolar kidney. No hydronephrosis. No urinary stone disease identified. Bladder is unremarkable. Stomach/Bowel: Stomach is within normal limits. Appendix appears normal. Sigmoid diverticulosis without findings of acute diverticulitis. Mild fluid-filled distention of small bowel loops diffusely without significant wall thickening. Increased size of duodenum diverticulum measuring 3 cm arising from the third segment of duodenum, no associated inflammation. Vascular/Lymphatic: Aortic atherosclerosis. No enlarged abdominal or pelvic lymph nodes. Reproductive: Status post hysterectomy. No adnexal masses. Other: No abdominal wall hernia or abnormality. No abdominopelvic ascites. Musculoskeletal: No fracture is seen. C3-4 grade 1 anterolisthesis. Advanced disc and facet degenerative changes of the visible thoracic and lumbar spine. Mild osteoarthrosis of sacroiliac joints, hip joints, and symphysis pubis. Ill-defined sclerotic focus  within the right ilium is stable from 2006 compatible benign etiology. IMPRESSION: 1. Mild fluid-filled distention of small bowel loops diffusely without significant wall thickening. Findings may reflect a mild ileus or enteritis. 2. Sigmoid diverticulosis without evidence of acute diverticulitis. 3. Stable mild intra and extrahepatic biliary ductal dilatation, likely compensatory post cholecystectomy. 4. Stable focal dilatation of main pancreatic duct at pancreatic head to 5 mm. 5. Aortic Atherosclerosis (ICD10-I70.0). Electronically Signed   By: Mitzi Hansen M.D.   On: 05/13/2018 19:00    Procedures Procedures (including critical care time)  Medications Ordered in ED Medications  ondansetron (ZOFRAN) injection 4 mg (has no administration in time range)  sodium chloride 0.9 % bolus 500 mL (0 mLs Intravenous Stopped 05/13/18 1745)  iohexol (OMNIPAQUE) 300 MG/ML solution 100 mL (100 mLs Intravenous Contrast Given 05/13/18 1800)  acetaminophen (TYLENOL) tablet 650 mg (650 mg Oral Given 05/13/18 1900)     Initial Impression / Assessment and Plan / ED Course  I have reviewed the triage vital signs and the nursing notes.  Pertinent labs & imaging results that were available during my care of the patient were reviewed by me and considered in my medical decision making (see chart for details).  Clinical Course as of May 13 2138  Wynelle LinkSun May 13, 2018  1920 DG Chest 2 View [EW]  [phone] Patient reevaluated, she is feeling much better, has passed p.o. challenge and is requesting to go home at this time.  She was unable to give UA however is not having any urinary symptoms.  Her reported shortness of breath has fully resolved.  Recommended PCP follow-up in the next 1 to 2 days.  Return precautions discussed with patient and her daughter who state their understanding.   [EH]  1957 Patient has urinated since being in the department but had loose stool at the same time.    [EH]    Clinical Course  User Index [EH] Cristina GongHammond, Rai Severns W, PA-C [EW] Mancel BaleWentz, Elliott, MD   Patient with symptoms consistent with viral gastroenteritis.  She has multiple family members sick with similar illnesses at home.  She was hemodynamically stable, however did report shortness of breath that started after she threw up.  Vitals are stable, no fever.  Chest x-ray did not show evidence of consolidation or other acute abnormality.  EKG and troponin were normal.  Lipase was not elevated.  White count is slightly elevated at 13.4.  She denied any urinary symptoms.  Unable to obtain a urine sample while in the ER due to her defecating when she would attempt to provide a urine sample.  She had generalized left-sided abdominal tenderness to palpation.  CT scan abdomen pelvis was performed showing diverticulosis without evidence of diverticulitis, in addition to enteritis.  Patient was p.o. challenged in the department.  She was given 500 mL's IV fluid and Tylenol after which she generally felt much better.  Zofran was ordered however was not needed or used.  Upon reevaluation patient and her daughter requested discharge home.  Patient reported that her shortness of breath fully resolved.  Her troponin was normal and chest x-ray was normal, given this in the setting of suspected viral gastroenteritis not consistent with ACS.  Patient was ambulatory while in the department without difficulties.  Recommended PCP follow-up in the next 1 to 2 days with return to the ER as needed.  Return precautions were discussed with patient who states their understanding.  At the time of discharge patient denied any unaddressed complaints or concerns.  Patient is agreeable for discharge home.  This patient was seen as a shared visit with Dr. Effie ShyWentz.   Final Clinical Impressions(s) / ED Diagnoses   Final diagnoses:  Gastroenteritis  Nausea vomiting and diarrhea  Generalized abdominal pain  Shortness of breath  Diverticulosis    ED Discharge  Orders         Ordered    ondansetron (ZOFRAN ODT) 4 MG disintegrating tablet  Every 8 hours PRN     05/13/18 1958           Cristina GongHammond, Kaitland Lewellyn W, Cordelia Poche-C 05/13/18 2142    Mancel BaleWentz, Elliott, MD 05/14/18 [phone]

## 2018-05-13 NOTE — ED Triage Notes (Signed)
Pt to ER from home with dtr.  C/O sudden onset SOB, vomiting, and diarrhea that started this morning. Pt woke up feeling ok, but got worse as the day went on. Denies fever, CP, diaphoresis, or dizziness. Hx dementia, DM, HTN

## 2018-05-13 NOTE — ED Provider Notes (Signed)
  Face-to-face evaluation   History: She presents for evaluation of nausea, vomiting and diarrhea all of which started today.  Her daughter is sick with similar illness, which started 3 days ago.  Her daughter returned home from the hospital today to find her mother in this situation.  Physical exam: Alert, calm, frail elderly female.  No respiratory distress.  Abdomen soft with mild left lower quadrant tenderness.  Medical screening examination/treatment/procedure(s) were conducted as a shared visit with non-physician practitioner(s) and myself.  I personally evaluated the patient during the encounter    Mancel BaleWentz, Deette Revak, MD 05/14/18 1129

## 2018-05-13 NOTE — Discharge Instructions (Signed)
Please make sure that you are drinking plenty of fluids to stay well-hydrated.  If you have any concerns please seek additional medical care and evaluation.  Please follow-up with your primary care doctor in the next 1 to 2 days.

## 2018-05-25 ENCOUNTER — Other Ambulatory Visit: Payer: Self-pay | Admitting: Nurse Practitioner

## 2018-05-25 ENCOUNTER — Ambulatory Visit
Admission: RE | Admit: 2018-05-25 | Discharge: 2018-05-25 | Disposition: A | Discharge status: Home / Self Care | Payer: Medicare (Managed Care) | Source: Ambulatory Visit | Attending: Nurse Practitioner | Admitting: Nurse Practitioner

## 2018-05-25 DIAGNOSIS — M25432 Effusion, left wrist: Secondary | ICD-10-CM

## 2018-06-05 ENCOUNTER — Other Ambulatory Visit: Payer: Self-pay | Admitting: Nurse Practitioner

## 2018-06-05 ENCOUNTER — Ambulatory Visit
Admission: RE | Admit: 2018-06-05 | Discharge: 2018-06-05 | Disposition: A | Discharge status: Home / Self Care | Payer: Medicare (Managed Care) | Source: Ambulatory Visit | Attending: Nurse Practitioner | Admitting: Nurse Practitioner

## 2018-06-05 DIAGNOSIS — M79642 Pain in left hand: Secondary | ICD-10-CM

## 2018-06-05 DIAGNOSIS — R609 Edema, unspecified: Secondary | ICD-10-CM

## 2018-06-06 ENCOUNTER — Ambulatory Visit (INDEPENDENT_AMBULATORY_CARE_PROVIDER_SITE_OTHER): Payer: No Typology Code available for payment source | Admitting: Orthopaedic Surgery

## 2018-06-06 ENCOUNTER — Encounter (INDEPENDENT_AMBULATORY_CARE_PROVIDER_SITE_OTHER): Payer: Self-pay | Admitting: Orthopaedic Surgery

## 2018-06-06 DIAGNOSIS — M25532 Pain in left wrist: Secondary | ICD-10-CM

## 2018-06-06 DIAGNOSIS — M79642 Pain in left hand: Secondary | ICD-10-CM | POA: Diagnosis not present

## 2018-06-06 MED ORDER — LIDOCAINE HCL 1 % IJ SOLN
1.0000 mL | INTRAMUSCULAR | Status: AC | PRN
  Administered 2018-06-06: 1 mL

## 2018-06-06 MED ORDER — METHYLPREDNISOLONE ACETATE 40 MG/ML IJ SUSP
40.0000 mg | INTRAMUSCULAR | Status: AC | PRN
  Administered 2018-06-06: 40 mg via INTRA_ARTICULAR

## 2018-06-06 NOTE — Progress Notes (Signed)
Office Visit Note   Patient: [name]           Date of Birth: [birth year]           MRN: [id number] Visit Date: 06/06/2018              Requested by: Jethro Bastos, MD [address] [place], [place] [address] PCP: Jethro Bastos, MD   Assessment & Plan: Visit Diagnoses:  1. Pain in left wrist   2. Pain in left hand     Plan: They understand this is something we should just treat conservatively.  Offered an injection of the radiocarpal joint of the left wrist with a steroid and explained the risk and benefits of this.  She tolerated the injection well.  We will try a Velcro wrist splint that they can take on and off as comfort allows and we showed her how to wear this.  All question concerns were answered and addressed.  Follow-up can be as needed.  Follow-Up Instructions: Return if symptoms worsen or fail to improve.   Orders:  Orders Placed This Encounter  Procedures  . Medium Joint Inj   No orders of the defined types were placed in this encounter.     Procedures: Medium Joint Inj: L radiocarpal on 06/06/2018 8:26 AM Medications: 1 mL lidocaine 1 %; 40 mg methylPREDNISolone acetate 40 MG/ML      Clinical Data: No additional findings.   Subjective: Chief Complaint  Patient presents with  . Left Wrist - Pain  The patient is a pleasant 84 year old was seen before.  She comes in with a week's worth of left wrist pain and swelling with no known injury.  X-rays were obtained yesterday and she was sent to [place] for further evaluation and treatment of left wrist pain.  She points the dorsal aspect of her wrist is source of her pain.  She does ambulate with an assistive device and that does put pressure on the wrist.  She denies any numbness and tingling in her hand.  She denies any elbow or shoulder issues on that left side.  She is right-handed.  HPI  Review of Systems She currently denies any headache, chest pain, shortness of breath, fever, chills,  nausea, vomiting.  Objective: Vital Signs: There were no vitals taken for this visit.  Physical Exam She is alert and oriented in no acute distress Ortho Exam Examination of her left wrist shows painful range of motion mainly dorsally.  This is at the radiocarpal joint.  She has weak grip and pinch strength but no muscle atrophy that is significant in her hand.  There is no redness.  Her hand is well-perfused. Specialty Comments:  No specialty comments available.  Imaging: Dg Hand Complete Left  Result Date: 06/05/2018 CLINICAL DATA:  Soft tissue swelling left hand. EXAM: LEFT HAND - COMPLETE 3+ VIEW COMPARISON:  05/25/2018. FINDINGS: Diffuse osteopenia and severe degenerative change again noted. Degenerative changes are particular severe about the entire wrist including the intercarpal and radiocarpal joints. Widening of the scapholunate space again noted. Scapholunate separation may be present. 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 Small bony densities are noted about the anterior aspect and posterior aspect of the carpus. These may represent small fracture fragments possibly from severe degenerative disease. Similar findings noted on prior exam. No radiopaque foreign body. IMPRESSION: 1. Diffuse osteopenia and severe degenerative change again noted. Widening of the scapholunate space again noted consistent with scapholunate separation. 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  2. Small bony densities noted about the carpus anteriorly and posteriorly. These may represent small fracture fragments possibly from severe degenerative disease. Similar findings noted on prior exam. Electronically Signed   By: Maisie Fushomas  Register   On: 06/05/2018 15:35   X-rays independently reviewed of her left hand and her left wrist show chronic degenerative changes throughout the radiocarpal joint with evidence of an SLAC wrist.  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  PMFS History: Patient Active Problem List    Diagnosis Date Noted  . DIZZINESS 06/29/2010  . DIABETES MELLITUS, TYPE II 05/18/2010  . DEMENTIA 05/18/2010  . HYPERTENSION 05/18/2010  . ASTHMA 05/18/2010  . COPD 05/18/2010  . GERD 05/18/2010  . OSTEOARTHRITIS 05/18/2010  . WEIGHT LOSS, ABNORMAL 05/18/2010   Past Medical History:  Diagnosis Date  . ASTHMA 05/18/2010  . COPD 05/18/2010  . DEMENTIA 05/18/2010  . DIABETES MELLITUS, TYPE II 05/18/2010  . DIZZINESS 06/29/2010  . GERD 05/18/2010  . HYPERTENSION 05/18/2010  . OSTEOARTHRITIS 05/18/2010    Family History  Problem Relation Age of Onset  . Arthritis Father   . Arthritis Mother     Past Surgical History:  Procedure Laterality Date  . ABDOMINAL HYSTERECTOMY    . CHOLECYSTECTOMY    . FOOT SURGERY     x's 2   Social History   Occupational History  . Not on file  Tobacco Use  . Smoking status: Former Smoker    Types: Cigarettes    Last attempt to quit: 06/20/1968    Years since quitting: 49.9  . Smokeless tobacco: Never Used  Substance and Sexual Activity  . Alcohol use: No  . Drug use: No  . Sexual activity: Not Currently

## 2018-11-08 ENCOUNTER — Ambulatory Visit
Admission: RE | Admit: 2018-11-08 | Discharge: 2018-11-08 | Disposition: A | Discharge status: Home / Self Care | Payer: Medicare (Managed Care) | Source: Ambulatory Visit | Attending: Family Medicine | Admitting: Family Medicine

## 2018-11-08 ENCOUNTER — Other Ambulatory Visit: Payer: Self-pay | Admitting: Family Medicine

## 2018-11-08 ENCOUNTER — Other Ambulatory Visit: Payer: Self-pay

## 2018-11-08 DIAGNOSIS — R519 Headache, unspecified: Secondary | ICD-10-CM

## 2018-12-18 ENCOUNTER — Other Ambulatory Visit: Payer: Self-pay | Admitting: Family Medicine

## 2018-12-18 ENCOUNTER — Ambulatory Visit
Admission: RE | Admit: 2018-12-18 | Discharge: 2018-12-18 | Disposition: A | Discharge status: Home / Self Care | Payer: Medicare (Managed Care) | Source: Ambulatory Visit | Attending: Family Medicine | Admitting: Family Medicine

## 2018-12-18 DIAGNOSIS — R053 Chronic cough: Secondary | ICD-10-CM

## 2018-12-18 DIAGNOSIS — R05 Cough: Secondary | ICD-10-CM

## 2019-06-23 ENCOUNTER — Emergency Department (HOSPITAL_COMMUNITY)
Admission: EM | Admit: 2019-06-23 | Discharge: 2019-06-23 | Disposition: A | Discharge status: Home / Self Care | Payer: Medicare (Managed Care) | Attending: Emergency Medicine | Admitting: Emergency Medicine

## 2019-06-23 ENCOUNTER — Encounter (HOSPITAL_COMMUNITY): Payer: Self-pay | Admitting: Emergency Medicine

## 2019-06-23 ENCOUNTER — Ambulatory Visit (INDEPENDENT_AMBULATORY_CARE_PROVIDER_SITE_OTHER)
Admission: EM | Admit: 2019-06-23 | Discharge: 2019-06-23 | Disposition: A | Discharge status: Home / Self Care | Payer: Medicare (Managed Care) | Source: Home / Self Care | Attending: Family Medicine | Admitting: Family Medicine

## 2019-06-23 ENCOUNTER — Other Ambulatory Visit: Payer: Self-pay

## 2019-06-23 DIAGNOSIS — Z5321 Procedure and treatment not carried out due to patient leaving prior to being seen by health care provider: Secondary | ICD-10-CM | POA: Insufficient documentation

## 2019-06-23 DIAGNOSIS — M545 Low back pain, unspecified: Secondary | ICD-10-CM

## 2019-06-23 LAB — POCT URINALYSIS DIP (DEVICE)
Bilirubin Urine: NEGATIVE
Glucose, UA: NEGATIVE mg/dL
Hgb urine dipstick: NEGATIVE
Ketones, ur: NEGATIVE mg/dL
Nitrite: NEGATIVE
Protein, ur: NEGATIVE mg/dL
Specific Gravity, Urine: >=1.03 (ref 1.005–1.030)
Urobilinogen, UA: 0.2 mg/dL (ref 0.0–1.0)
pH: 5.5 (ref 5.0–8.0)

## 2019-06-23 MED ORDER — TRAMADOL-ACETAMINOPHEN 37.5-325 MG PO TABS: 1.0000 | ORAL_TABLET | Freq: Four times a day (QID) | ORAL | 0 refills | Status: AC | PRN

## 2019-06-23 NOTE — ED Triage Notes (Signed)
Per family, states her mother has been complaining of lower back pain since yesterday-no dysuria, no injury

## 2019-06-23 NOTE — Discharge Instructions (Addendum)
Ice or heat to area Activity as tolerated Give pain medicine as needed Caution drowsiness on pain medicine Call your PCP if not improving in a couple of days We will call if the urine test is positive

## 2019-06-23 NOTE — ED Provider Notes (Signed)
[facility]    CSN: [id number] Arrival date & time: 06/23/19  1042      History   Chief Complaint Chief Complaint  Patient presents with  . Back Pain    HPI [name] is a 86 y.o. female.   HPI  This is an 86 year old woman.  She has dementia.  She lives with her daughter.  The daughter brings her in today because she cried out in pain when she was moving her stated her back hurt.  She has had back pain before.  Known arthritis.  Daughter is worried that she might have a urinary tract infection.  She has had bladder infections before and sometimes complains of back pain.  No fever or chills.  No change in urinary habits or incontinence.  No change in activity or awareness. I reviewed the patient's chart.  She had a lumbar spine x-ray performed in 2011.  She has degenerative disc disease diffusely throughout her back with a small scoliosis, anterior lower listhesis, and a sclerotic region lesion in the superior medial aspect of the right iliac wing  Past Medical History:  Diagnosis Date  . ASTHMA 05/18/2010  . COPD 05/18/2010  . DEMENTIA 05/18/2010  . DIABETES MELLITUS, TYPE II 05/18/2010  . DIZZINESS 06/29/2010  . GERD 05/18/2010  . HYPERTENSION 05/18/2010  . OSTEOARTHRITIS 05/18/2010    Patient Active Problem List   Diagnosis Date Noted  . DIZZINESS 06/29/2010  . DIABETES MELLITUS, TYPE II 05/18/2010  . DEMENTIA 05/18/2010  . HYPERTENSION 05/18/2010  . ASTHMA 05/18/2010  . COPD 05/18/2010  . GERD 05/18/2010  . OSTEOARTHRITIS 05/18/2010  . WEIGHT LOSS, ABNORMAL 05/18/2010    Past Surgical History:  Procedure Laterality Date  . ABDOMINAL HYSTERECTOMY    . CHOLECYSTECTOMY    . FOOT SURGERY     x's 2    OB History   No obstetric history on file.      Home Medications    Prior to Admission medications   Medication Sig Start Date End Date Taking? Authorizing Provider  acetaminophen (TYLENOL 8 HOUR) 650 MG CR tablet Take 1 tablet (650 mg  total) by mouth every 8 (eight) hours as needed for pain. 02/21/18   Varney Biles, MD  albuterol (ACCUNEB) 0.63 MG/3ML nebulizer solution Take 1 ampule by nebulization every 4 (four) hours as needed for wheezing or shortness of breath.     [provider]  amLODipine (NORVASC) 5 MG tablet Take 5 mg by mouth daily.    [provider]  aspirin 81 MG chewable tablet Chew 81 mg by mouth daily.    [provider]  cholecalciferol (VITAMIN D) 1000 UNITS tablet Take 1,000 Units by mouth daily.    [provider]  donepezil (ARICEPT) 10 MG tablet Take 10 mg by mouth at bedtime.     [provider]  glimepiride (AMARYL) 2 MG tablet Take 2 mg by mouth daily before breakfast.     [provider]  metoCLOPramide (REGLAN) 10 MG tablet Take 10 mg by mouth every 6 (six) hours as needed for nausea or vomiting.     [provider]  olmesartan-hydrochlorothiazide (BENICAR HCT) 40-25 MG per tablet Take 1 tablet by mouth daily.     [provider]  traMADol-acetaminophen (ULTRACET) 37.5-325 MG tablet Take 1 tablet by mouth every 6 (six) hours as needed for moderate pain. 06/23/19   Raylene Everts, MD  trimethoprim (TRIMPEX) 100 MG tablet Take 100 mg by mouth daily.  [provider]    Family History Family History  Problem Relation Age of Onset  . Arthritis Father   . Arthritis Mother     Social History Social History   Tobacco Use  . Smoking status: Former Smoker    Types: Cigarettes    Quit date: 06/20/1968    Years since quitting: 51.0  . Smokeless tobacco: Never Used  Substance Use Topics  . Alcohol use: No  . Drug use: No     Allergies   Morphine, Penicillins, and Codeine   Review of Systems Review of Systems  Constitutional: Negative for chills and fever.  HENT: Negative for congestion and hearing loss.   Eyes: Negative for pain.  Respiratory: Negative for cough and shortness of breath.     Cardiovascular: Negative for chest pain and leg swelling.  Gastrointestinal: Negative for abdominal pain, constipation and diarrhea.  Genitourinary: Negative for difficulty urinating, dysuria, flank pain and frequency.  Musculoskeletal: Positive for back pain. Negative for myalgias.  Neurological: Negative for dizziness, seizures and headaches.  Psychiatric/Behavioral: Negative for behavioral problems and confusion. The patient is not nervous/anxious.      Physical Exam Triage Vital Signs ED Triage Vitals  Enc Vitals Group     BP 06/23/19 1137 125/65     Pulse Rate 06/23/19 1137 84     Resp --      Temp 06/23/19 1137 98.3 F (36.8 C)     Temp Source 06/23/19 1137 Oral     SpO2 06/23/19 1137 98 %     Weight 06/23/19 1134 118 lb (53.5 kg)     Height --      Head Circumference --      Peak Flow --      Pain Score 06/23/19 1133 8     Pain Loc --      Pain Edu? --      Excl. in GC? --    No data found.  Updated Vital Signs BP 125/65 (BP Location: Right Arm)   Pulse 84   Temp 98.3 F (36.8 C) (Oral)   Wt 53.5 kg   SpO2 98%   BMI 20.25 kg/m   Visual Acuity Right Eye Distance:   Left Eye Distance:   Bilateral Distance:    Right Eye Near:   Left Eye Near:    Bilateral Near:     Physical Exam Constitutional:      General: She is not in acute distress.    Appearance: She is well-developed.     Comments: Patient is thin.  In wheelchair.  Poor eye contact.  Will answer direct questions with simple answers.  Looks to daughter for answers most of the time.  HENT:     Head: Normocephalic and atraumatic.     Mouth/Throat:     Comments: Mask in place Eyes:     Conjunctiva/sclera: Conjunctivae normal.     Pupils: Pupils are equal, round, and reactive to light.  Cardiovascular:     Rate and Rhythm: Normal rate and regular rhythm.     Heart sounds: Normal heart sounds.  Pulmonary:     Effort: Pulmonary effort is normal. No respiratory distress.     Breath sounds:  Normal breath sounds.  Abdominal:     General: There is no distension.     Palpations: Abdomen is soft.     Tenderness: There is no abdominal tenderness.  Musculoskeletal:        General: Normal range of motion.  Cervical back: Normal range of motion.     Comments: Tenderness over the left SI joint to moderate pressure.  No tenderness over the lumbar spines or musculature.  Skin:    General: Skin is warm and dry.  Neurological:     Mental Status: She is alert. Mental status is at baseline.  Psychiatric:        Behavior: Behavior normal.      UC Treatments / Results  Labs (all labs ordered are listed, but only abnormal results are displayed) Labs Reviewed  POCT URINALYSIS DIP (DEVICE) - Abnormal; Notable for the following components:      Result Value   Leukocytes,Ua TRACE (*)    All other components within normal limits  URINE CULTURE    EKG   Radiology No results found.  Procedures Procedures (including critical care time)  Medications Ordered in UC Medications - No data to display  Initial Impression / Assessment and Plan / UC Course  I have reviewed the triage vital signs and the nursing notes.  Pertinent labs & imaging results that were available during my care of the patient were reviewed by me and considered in my medical decision making (see chart for details).     I feel like patient is having musculoskeletal back pain.  No fall or injury.  No trauma.  I think it safe to treat her with pain medication activity limitation, with close follow-up if she fails to improve in a couple days. Final Clinical Impressions(s) / UC Diagnoses   Final diagnoses:  Acute left-sided low back pain without sciatica     Discharge Instructions     Ice or heat to area Activity as tolerated Give pain medicine as needed Caution drowsiness on pain medicine Call your PCP if not improving in a couple of days We will call if the urine test is positive   ED  Prescriptions    Medication Sig Dispense Auth. Provider   traMADol-acetaminophen (ULTRACET) 37.5-325 MG tablet Take 1 tablet by mouth every 6 (six) hours as needed for moderate pain. 20 tablet Eustace Moore, MD     I have reviewed the PDMP during this encounter.   Eustace Moore, MD 06/23/19 1359

## 2019-06-23 NOTE — ED Triage Notes (Signed)
Pt states she has back pain that started last night.

## 2019-06-24 LAB — URINE CULTURE

## 2020-04-30 ENCOUNTER — Ambulatory Visit: Payer: Medicare (Managed Care) | Attending: Internal Medicine

## 2020-04-30 DIAGNOSIS — Z23 Encounter for immunization: Secondary | ICD-10-CM

## 2020-04-30 NOTE — Progress Notes (Signed)
   Covid-19 Vaccination Clinic  Name:  [name]    MRN: [id number] DOB: 01/27/1928  04/30/2020  [name] was observed post Covid-19 immunization for 15 minutes without incident. She was provided with Vaccine Information Sheet and instruction to access the V-Safe system.   [name] was instructed to call 911 with any severe reactions post vaccine: Marland Kitchen Difficulty breathing  . Swelling of face and throat  . A fast heartbeat  . A bad rash all over body  . Dizziness and weakness

## 2020-08-24 ENCOUNTER — Other Ambulatory Visit: Payer: Self-pay | Admitting: Vascular Surgery

## 2020-08-24 ENCOUNTER — Ambulatory Visit
Admission: RE | Admit: 2020-08-24 | Discharge: 2020-08-24 | Disposition: A | Discharge status: Home / Self Care | Payer: Medicare (Managed Care) | Source: Ambulatory Visit | Attending: Vascular Surgery | Admitting: Vascular Surgery

## 2020-08-24 ENCOUNTER — Other Ambulatory Visit: Payer: Self-pay

## 2020-08-24 DIAGNOSIS — J4540 Moderate persistent asthma, uncomplicated: Secondary | ICD-10-CM

## 2023-03-10 ENCOUNTER — Other Ambulatory Visit: Payer: Self-pay

## 2023-03-10 ENCOUNTER — Encounter (HOSPITAL_COMMUNITY): Payer: Self-pay | Admitting: *Deleted

## 2023-03-10 ENCOUNTER — Emergency Department (HOSPITAL_COMMUNITY)
Admission: EM | Admit: 2023-03-10 | Discharge: 2023-03-11 | Disposition: A | Discharge status: Home / Self Care | Payer: Medicare (Managed Care) | Attending: Emergency Medicine | Admitting: Emergency Medicine

## 2023-03-10 DIAGNOSIS — R1110 Vomiting, unspecified: Secondary | ICD-10-CM | POA: Insufficient documentation

## 2023-03-10 DIAGNOSIS — D72829 Elevated white blood cell count, unspecified: Secondary | ICD-10-CM | POA: Diagnosis not present

## 2023-03-10 DIAGNOSIS — K5900 Constipation, unspecified: Secondary | ICD-10-CM | POA: Insufficient documentation

## 2023-03-10 DIAGNOSIS — R739 Hyperglycemia, unspecified: Secondary | ICD-10-CM | POA: Insufficient documentation

## 2023-03-10 DIAGNOSIS — Z7982 Long term (current) use of aspirin: Secondary | ICD-10-CM | POA: Insufficient documentation

## 2023-03-10 DIAGNOSIS — Z5321 Procedure and treatment not carried out due to patient leaving prior to being seen by health care provider: Secondary | ICD-10-CM | POA: Insufficient documentation

## 2023-03-10 DIAGNOSIS — Z7984 Long term (current) use of oral hypoglycemic drugs: Secondary | ICD-10-CM | POA: Insufficient documentation

## 2023-03-10 DIAGNOSIS — R109 Unspecified abdominal pain: Secondary | ICD-10-CM | POA: Diagnosis present

## 2023-03-10 LAB — COMPREHENSIVE METABOLIC PANEL
ALT: 15 U/L (ref 0–44)
AST: 20 U/L (ref 15–41)
Albumin: 4.2 g/dL (ref 3.5–5.0)
Alkaline Phosphatase: 72 U/L (ref 38–126)
Anion gap: 16 — ABNORMAL HIGH (ref 5–15)
BUN: 22 mg/dL (ref 8–23)
CO2: 27 mmol/L (ref 22–32)
Calcium: 10.4 mg/dL — ABNORMAL HIGH (ref 8.9–10.3)
Chloride: 98 mmol/L (ref 98–111)
Creatinine, Ser: 0.86 mg/dL (ref 0.44–1.00)
GFR, Estimated: >60 mL/min (ref >60)
Glucose, Bld: 171 mg/dL — ABNORMAL HIGH (ref 70–99)
Potassium: 4.5 mmol/L (ref 3.5–5.1)
Sodium: 141 mmol/L (ref 135–145)
Total Bilirubin: 0.6 mg/dL (ref 0.3–1.2)
Total Protein: 7.9 g/dL (ref 6.5–8.1)

## 2023-03-10 LAB — CBC
HCT: 41.3 % (ref 36.0–46.0)
Hemoglobin: 13.5 g/dL (ref 12.0–15.0)
MCH: 31.3 pg (ref 26.0–34.0)
MCHC: 32.7 g/dL (ref 30.0–36.0)
MCV: 95.8 fL (ref 80.0–100.0)
Platelets: 291 10*3/uL (ref 150–400)
RBC: 4.31 MIL/uL (ref 3.87–5.11)
RDW: 12.4 % (ref 11.5–15.5)
WBC: 13.9 10*3/uL — ABNORMAL HIGH (ref 4.0–10.5)
nRBC: 0 % (ref 0.0–0.2)

## 2023-03-10 LAB — LIPASE, BLOOD: Lipase: 34 U/L (ref 11–51)

## 2023-03-10 MED ORDER — ONDANSETRON 4 MG PO TBDP
8.0000 mg | ORAL_TABLET | Freq: Once | ORAL | Status: AC
  Administered 2023-03-10: 8 mg via ORAL
  Filled 2023-03-10: qty 2

## 2023-03-10 NOTE — ED Triage Notes (Signed)
Abd pain for 2-3 days she has also been constipated for 2 days  vomiting today

## 2023-03-11 ENCOUNTER — Encounter (HOSPITAL_COMMUNITY): Payer: Self-pay

## 2023-03-11 ENCOUNTER — Emergency Department (HOSPITAL_COMMUNITY): Payer: Medicare (Managed Care)

## 2023-03-11 ENCOUNTER — Emergency Department (HOSPITAL_COMMUNITY)
Admission: EM | Admit: 2023-03-11 | Discharge: 2023-03-11 | Disposition: A | Discharge status: Home / Self Care | Payer: Medicare (Managed Care) | Attending: Emergency Medicine | Admitting: Emergency Medicine

## 2023-03-11 DIAGNOSIS — Z7982 Long term (current) use of aspirin: Secondary | ICD-10-CM | POA: Insufficient documentation

## 2023-03-11 DIAGNOSIS — R739 Hyperglycemia, unspecified: Secondary | ICD-10-CM | POA: Insufficient documentation

## 2023-03-11 DIAGNOSIS — K5900 Constipation, unspecified: Secondary | ICD-10-CM | POA: Insufficient documentation

## 2023-03-11 DIAGNOSIS — Z7984 Long term (current) use of oral hypoglycemic drugs: Secondary | ICD-10-CM | POA: Insufficient documentation

## 2023-03-11 DIAGNOSIS — D72829 Elevated white blood cell count, unspecified: Secondary | ICD-10-CM | POA: Insufficient documentation

## 2023-03-11 LAB — COMPREHENSIVE METABOLIC PANEL
ALT: 15 U/L (ref 0–44)
AST: 28 U/L (ref 15–41)
Albumin: 4.4 g/dL (ref 3.5–5.0)
Alkaline Phosphatase: 82 U/L (ref 38–126)
Anion gap: 13 (ref 5–15)
BUN: 24 mg/dL — ABNORMAL HIGH (ref 8–23)
CO2: 26 mmol/L (ref 22–32)
Calcium: 10 mg/dL (ref 8.9–10.3)
Chloride: 98 mmol/L (ref 98–111)
Creatinine, Ser: 0.87 mg/dL (ref 0.44–1.00)
GFR, Estimated: >60 mL/min (ref >60)
Glucose, Bld: 205 mg/dL — ABNORMAL HIGH (ref 70–99)
Potassium: 3.9 mmol/L (ref 3.5–5.1)
Sodium: 137 mmol/L (ref 135–145)
Total Bilirubin: 0.8 mg/dL (ref 0.3–1.2)
Total Protein: 8.5 g/dL — ABNORMAL HIGH (ref 6.5–8.1)

## 2023-03-11 LAB — CBC WITH DIFFERENTIAL/PLATELET
Abs Immature Granulocytes: 0.07 10*3/uL (ref 0.00–0.07)
Basophils Absolute: 0 10*3/uL (ref 0.0–0.1)
Basophils Relative: 0 %
Eosinophils Absolute: 0 10*3/uL (ref 0.0–0.5)
Eosinophils Relative: 0 %
HCT: 41.4 % (ref 36.0–46.0)
Hemoglobin: 13.4 g/dL (ref 12.0–15.0)
Immature Granulocytes: 1 %
Lymphocytes Relative: 4 %
Lymphs Abs: 0.5 10*3/uL — ABNORMAL LOW (ref 0.7–4.0)
MCH: 30.9 pg (ref 26.0–34.0)
MCHC: 32.4 g/dL (ref 30.0–36.0)
MCV: 95.4 fL (ref 80.0–100.0)
Monocytes Absolute: 0.5 10*3/uL (ref 0.1–1.0)
Monocytes Relative: 3 %
Neutro Abs: 14 10*3/uL — ABNORMAL HIGH (ref 1.7–7.7)
Neutrophils Relative %: 92 %
Platelets: 266 10*3/uL (ref 150–400)
RBC: 4.34 MIL/uL (ref 3.87–5.11)
RDW: 12.3 % (ref 11.5–15.5)
WBC: 15.1 10*3/uL — ABNORMAL HIGH (ref 4.0–10.5)
nRBC: 0 % (ref 0.0–0.2)

## 2023-03-11 LAB — LIPASE, BLOOD: Lipase: 28 U/L (ref 11–51)

## 2023-03-11 MED ORDER — LACTATED RINGERS IV SOLN: INTRAVENOUS | Status: DC

## 2023-03-11 MED ORDER — ACETAMINOPHEN 325 MG PO TABS
650.0000 mg | ORAL_TABLET | Freq: Once | ORAL | Status: AC
  Administered 2023-03-11: 650 mg via ORAL
  Filled 2023-03-11: qty 2

## 2023-03-11 NOTE — ED Notes (Signed)
Soap sud enema administered at bedside

## 2023-03-11 NOTE — ED Provider Notes (Signed)
[place] [facility] AT [facility] Provider Note   CSN: [id number] Arrival date & time: 03/11/23  4098     History  Chief Complaint  Patient presents with   Constipation   Emesis    [name] is a 89 y.o. female.  This is a 89 year old lady who presents with abdominal pain secondary constipation.  She has had nonbilious emesis x 3.  He has not been passing any flatus.  No fever or chills.  Is unsure when her last bowel movement was.  Has tried over-the-counter medications without resolve.  Denies any urinary symptoms.       Home Medications Prior to Admission medications   Medication Sig Start Date End Date Taking? Authorizing Provider  acetaminophen (TYLENOL 8 HOUR) 650 MG CR tablet Take 1 tablet (650 mg total) by mouth every 8 (eight) hours as needed for pain. 02/21/18   Derwood Kaplan, MD  albuterol (ACCUNEB) 0.63 MG/3ML nebulizer solution Take 1 ampule by nebulization every 4 (four) hours as needed for wheezing or shortness of breath.     [provider]  amLODipine (NORVASC) 5 MG tablet Take 5 mg by mouth daily.    [provider]  aspirin 81 MG chewable tablet Chew 81 mg by mouth daily.    [provider]  cholecalciferol (VITAMIN D) 1000 UNITS tablet Take 1,000 Units by mouth daily.    [provider]  donepezil (ARICEPT) 10 MG tablet Take 10 mg by mouth at bedtime.     [provider]  glimepiride (AMARYL) 2 MG tablet Take 2 mg by mouth daily before breakfast.     [provider]  metoCLOPramide (REGLAN) 10 MG tablet Take 10 mg by mouth every 6 (six) hours as needed for nausea or vomiting.     [provider]  olmesartan-hydrochlorothiazide (BENICAR HCT) 40-25 MG per tablet Take 1 tablet by mouth daily.     [provider]  traMADol-acetaminophen (ULTRACET) 37.5-325 MG tablet Take 1 tablet by mouth every 6 (six) hours as needed for moderate pain. 06/23/19   Eustace Moore, MD  trimethoprim (TRIMPEX) 100 MG tablet Take 100 mg by mouth daily.     [provider]      Allergies    Morphine, Penicillins, and Codeine    Review of Systems   Review of Systems  All other systems reviewed and are negative.   Physical Exam Updated Vital Signs BP 127/71 (BP Location: Right Arm)   Pulse (!) 51   Temp 98.3 F (36.8 C) (Oral)   Resp 17   SpO2 100%  Physical Exam Vitals and nursing note reviewed.  Constitutional:      General: She is not in acute distress.    Appearance: Normal appearance. She is well-developed. She is not toxic-appearing.  HENT:     Head: Normocephalic and atraumatic.  Eyes:     General: Lids are normal.     Conjunctiva/sclera: Conjunctivae normal.     Pupils: Pupils are equal, round, and reactive to light.  Neck:     Thyroid: No thyroid mass.     Trachea: No tracheal deviation.  Cardiovascular:     Rate and Rhythm: Normal rate and regular rhythm.     Heart sounds: Normal heart sounds. No murmur heard.    No gallop.  Pulmonary:     Effort: Pulmonary effort is normal. No respiratory distress.     Breath sounds: Normal breath sounds. No stridor. No decreased breath  sounds, wheezing, rhonchi or rales.  Abdominal:     General: There is distension.     Palpations: Abdomen is soft.     Tenderness: There is no abdominal tenderness. There is no guarding or rebound.  Musculoskeletal:        General: No tenderness. Normal range of motion.     Cervical back: Normal range of motion and neck supple.  Skin:    General: Skin is warm and dry.     Findings: No abrasion or rash.  Neurological:     Mental Status: She is alert and oriented to person, place, and time. Mental status is at baseline.     GCS: GCS eye subscore is 4. GCS verbal subscore is 5. GCS motor subscore is 6.     Cranial Nerves: Cranial nerves are intact. No cranial nerve deficit.     Sensory: No sensory deficit.     Motor: Motor function is intact.  Psychiatric:         Attention and Perception: Attention normal.        Speech: Speech normal.        Behavior: Behavior normal.     ED Results / Procedures / Treatments   Labs (all labs ordered are listed, but only abnormal results are displayed) Labs Reviewed - No data to display  EKG None  Radiology No results found.  Procedures Procedures    Medications Ordered in ED Medications  lactated ringers infusion (has no administration in time range)    ED Course/ Medical Decision Making/ A&P                                 Medical Decision Making Amount and/or Complexity of Data Reviewed Labs: ordered. Radiology: ordered.  Risk OTC drugs. Prescription drug management.   Patient presents with concerns for constipation as well as emesis.  Concern for possible bowel obstruction.  She did have some abdominal distention.  Patient CBC showed leukocytosis 15,000.  She subsequently had abdominal CT which showed moderate stool burden in the colon from interpretation.  No evidence of free air.  Was given enema by nursing with great results.  Feels much better at this time.  Repeat abdominal exam shows decreased size.  Patient's electrolytes significant for mild hyperglycemia at 205.  Otherwise no other significant abnormalities.          Final Clinical Impression(s) / ED Diagnoses Final diagnoses:  None    Rx / DC Orders ED Discharge Orders     None         Lorre Nick, MD 03/11/23 1308

## 2023-03-11 NOTE — ED Notes (Signed)
Patient transported to CT 

## 2023-03-11 NOTE — ED Notes (Signed)
Patient approached this tech in the lobby & stated that she couldn't wait to be seen any longer and wishes to leave the ED.

## 2023-03-11 NOTE — ED Triage Notes (Signed)
Pt presents with c/o constipation for 2-3 days. Caregiver reports that she also been vomiting yesterday.

## 2023-03-21 DEATH — deceased
